# Patient Record
Sex: Female | Born: 1970 | Race: Black or African American | Hispanic: No | Marital: Single | Smoking: Current every day smoker
Health system: Southern US, Community
[De-identification: ages and names within clinical notes are randomized; demographics above are authoritative.]

## PROBLEM LIST (undated history)

## (undated) DIAGNOSIS — E669 Obesity, unspecified: Secondary | ICD-10-CM

## (undated) DIAGNOSIS — I10 Essential (primary) hypertension: Secondary | ICD-10-CM

## (undated) HISTORY — PX: OVARIAN CYST SURGERY: SHX726

---

## 2012-08-06 ENCOUNTER — Encounter (HOSPITAL_BASED_OUTPATIENT_CLINIC_OR_DEPARTMENT_OTHER): Payer: Self-pay

## 2012-08-06 ENCOUNTER — Emergency Department (HOSPITAL_BASED_OUTPATIENT_CLINIC_OR_DEPARTMENT_OTHER)
Admission: EM | Admit: 2012-08-06 | Discharge: 2012-08-06 | Disposition: A | Discharge status: Home / Self Care | Payer: Self-pay | Attending: Emergency Medicine | Admitting: Emergency Medicine

## 2012-08-06 DIAGNOSIS — F17200 Nicotine dependence, unspecified, uncomplicated: Secondary | ICD-10-CM | POA: Insufficient documentation

## 2012-08-06 DIAGNOSIS — H6121 Impacted cerumen, right ear: Secondary | ICD-10-CM

## 2012-08-06 DIAGNOSIS — R51 Headache: Secondary | ICD-10-CM | POA: Insufficient documentation

## 2012-08-06 DIAGNOSIS — J3489 Other specified disorders of nose and nasal sinuses: Secondary | ICD-10-CM | POA: Insufficient documentation

## 2012-08-06 DIAGNOSIS — E669 Obesity, unspecified: Secondary | ICD-10-CM | POA: Insufficient documentation

## 2012-08-06 DIAGNOSIS — H6120 Impacted cerumen, unspecified ear: Secondary | ICD-10-CM | POA: Insufficient documentation

## 2012-08-06 HISTORY — DX: Obesity, unspecified: E66.9

## 2012-08-06 NOTE — ED Notes (Signed)
Pt states that she has lost the hearing in her R ear about a week ago and that she is having intermittent pain in that ear as well.  Pt denies any drainage from the ear.  Pt c/o nasal congestion, and headache.

## 2012-08-06 NOTE — ED Notes (Signed)
Pt. Reports she can hear much better now and no drainage noted from the R ear.

## 2012-08-06 NOTE — ED Provider Notes (Signed)
Medical screening examination/treatment/procedure(s) were performed by non-physician practitioner and as supervising physician I was immediately available for consultation/collaboration.  Ethelda Chick, MD 08/06/12 [phone]

## 2012-08-06 NOTE — ED Provider Notes (Signed)
History     CSN: [id number]  Arrival date & time 08/06/12  1549   First MD Initiated Contact with Patient 08/06/12 1619      Chief Complaint  Patient presents with  . Hearing Problem  . Otalgia    (Consider location/radiation/quality/duration/timing/severity/associated sxs/prior treatment) HPI  Vanessa Gregory is a 42 y.o. female complaining of reduced hearing acuity out of right ear approximately one year week ago. She also endorses in otalgia in the same ear. She denies drainage, rhinorrhea, fever she does endorse right-sided nasal congestion and right-sided headache.  Past Medical History  Diagnosis Date  . Obesity     History reviewed. No pertinent past surgical history.  History reviewed. No pertinent family history.  History  Substance Use Topics  . Smoking status: Current Every Day Smoker -- 0.50 packs/day for 20 years    Types: Cigarettes  . Smokeless tobacco: Never Used  . Alcohol Use: 8.4 oz/week    14 Glasses of wine per week    OB History   Grav Para Term Preterm Abortions TAB SAB Ect Mult Living                  Review of Systems  Constitutional: Negative for fever.  HENT: Positive for ear pain and congestion.   Respiratory: Negative for shortness of breath.   Cardiovascular: Negative for chest pain.  Gastrointestinal: Negative for nausea, vomiting, abdominal pain and diarrhea.  Neurological: Positive for headaches.  All other systems reviewed and are negative.    Allergies  Review of patient's allergies indicates no known allergies.  Home Medications   Current Outpatient Rx  Name  Route  Sig  Dispense  Refill  . ibuprofen (ADVIL,MOTRIN) 200 MG tablet   Oral   Take 600 mg by mouth every 6 (six) hours as needed for pain.           BP 127/70  Pulse 77  Temp(Src) 98.8 F (37.1 C) (Oral)  Resp 18  Ht 5\' 9"  (1.753 m)  Wt 190 lb (86.183 kg)  BMI 28.05 kg/m2  SpO2 100%  LMP 07/14/2012  Physical Exam  Nursing note and vitals  reviewed. Constitutional: She is oriented to person, place, and time. She appears well-developed and well-nourished. No distress.  HENT:  Head: Normocephalic.  Right Ear: External ear normal.  Left Ear: External ear normal.  Mouth/Throat: Oropharynx is clear and moist.  Bilateral serum impaction  Eyes: Conjunctivae and EOM are normal. Pupils are equal, round, and reactive to light.  Cardiovascular: Normal rate, regular rhythm and intact distal pulses.   Pulmonary/Chest: Effort normal and breath sounds normal. No stridor. No respiratory distress. She has no wheezes. She has no rales. She exhibits no tenderness.  Abdominal: Soft. Bowel sounds are normal. She exhibits no distension and no mass. There is no tenderness. There is no rebound and no guarding.  Musculoskeletal: Normal range of motion.  Lymphadenopathy:    She has no cervical adenopathy.  Neurological: She is alert and oriented to person, place, and time.  Psychiatric: She has a normal mood and affect.    ED Course  Procedures (including critical care time)  Labs Reviewed - No data to display No results found.  5:12 PM  patient reports complete resolution of symptoms after cerumen  irrigation   1. Cerumen impaction, right       MDM   [name] is a 42 y.o. female with bilateral cerumen impaction, right-sided asymptomatic. After irrigation and removal patient reports complete resolution  of symptoms.   Filed Vitals:   08/06/12 1558  BP: 127/70  Pulse: 77  Temp: 98.8 F (37.1 C)  TempSrc: Oral  Resp: 18  Height: 5\' 9"  (1.753 m)  Weight: 190 lb (86.183 kg)  SpO2: 100%     Pt verbalized understanding and agrees with care plan. Outpatient follow-up and return precautions given.        Wynetta Emery, PA-C 08/06/12 1724

## 2013-02-26 ENCOUNTER — Encounter (HOSPITAL_BASED_OUTPATIENT_CLINIC_OR_DEPARTMENT_OTHER): Payer: Self-pay | Admitting: Emergency Medicine

## 2013-02-26 ENCOUNTER — Emergency Department (HOSPITAL_BASED_OUTPATIENT_CLINIC_OR_DEPARTMENT_OTHER)
Admission: EM | Admit: 2013-02-26 | Discharge: 2013-02-26 | Disposition: A | Discharge status: Home / Self Care | Payer: Self-pay | Attending: Emergency Medicine | Admitting: Emergency Medicine

## 2013-02-26 DIAGNOSIS — R220 Localized swelling, mass and lump, head: Secondary | ICD-10-CM | POA: Insufficient documentation

## 2013-02-26 DIAGNOSIS — E669 Obesity, unspecified: Secondary | ICD-10-CM | POA: Insufficient documentation

## 2013-02-26 DIAGNOSIS — J3489 Other specified disorders of nose and nasal sinuses: Secondary | ICD-10-CM | POA: Insufficient documentation

## 2013-02-26 DIAGNOSIS — F17200 Nicotine dependence, unspecified, uncomplicated: Secondary | ICD-10-CM | POA: Insufficient documentation

## 2013-02-26 DIAGNOSIS — H109 Unspecified conjunctivitis: Secondary | ICD-10-CM | POA: Insufficient documentation

## 2013-02-26 MED ORDER — ERYTHROMYCIN 5 MG/GM OP OINT: TOPICAL_OINTMENT | OPHTHALMIC | Status: DC

## 2013-02-26 NOTE — ED Provider Notes (Signed)
CSN: [id number]     Arrival date & time 02/26/13  0909 History   First MD Initiated Contact with Patient 02/26/13 [phone]     Chief Complaint  Patient presents with  . pink eye and facial swelling    (Consider location/radiation/quality/duration/timing/severity/associated sxs/prior Treatment) HPI Comments: 42 year old female presents with one week of right eye conjunctivitis. She states that numerous family members, including her child, grandchild, and other family members have all had symptoms similar to this. She now noticed that her left eye started become irritated. She is felt she's had swelling around her eye. She's also had congestion and "feeling like I'm getting a cold". As a fevers or chills. Denies any nausea or vomiting. She does not wear contacts. She states that she feels her vision is a little more blurry but is due to the excessive tearing.   Past Medical History  Diagnosis Date  . Obesity    History reviewed. No pertinent past surgical history. History reviewed. No pertinent family history. History  Substance Use Topics  . Smoking status: Current Every Day Smoker -- 0.50 packs/day for 20 years    Types: Cigarettes  . Smokeless tobacco: Never Used  . Alcohol Use: 8.4 oz/week    14 Glasses of wine per week   OB History   Grav Para Term Preterm Abortions TAB SAB Ect Mult Living                 Review of Systems  Constitutional: Negative for fever and chills.  HENT: Positive for congestion and rhinorrhea.   Eyes: Positive for pain, discharge (clear) and redness.  Respiratory: Negative for shortness of breath.   All other systems reviewed and are negative.    Allergies  Review of patient's allergies indicates no known allergies.  Home Medications   Current Outpatient Rx  Name  Route  Sig  Dispense  Refill  . erythromycin ophthalmic ointment      Place a 1/2 inch ribbon of ointment into the lower eyelid.   1 g   0   . ibuprofen (ADVIL,MOTRIN) 200 MG  tablet   Oral   Take 600 mg by mouth every 6 (six) hours as needed for pain.          BP 137/70  Pulse 72  Temp(Src) 98.3 F (36.8 C) (Oral)  Resp 16  Ht 5\' 9"  (1.753 m)  Wt 200 lb (90.719 kg)  BMI 29.52 kg/m2  SpO2 100%  LMP 02/18/2013 Physical Exam  Nursing note and vitals reviewed. Constitutional: She is oriented to person, place, and time. She appears well-developed and well-nourished.  HENT:  Head: Normocephalic and atraumatic.  Right Ear: External ear normal.  Left Ear: External ear normal.  Nose: Nose normal.  Eyes: EOM are normal. Pupils are equal, round, and reactive to light. Right conjunctiva is injected. Left conjunctiva is injected (Medial).  Tearing noted bilaterally  Pulmonary/Chest: Effort normal.  Abdominal: She exhibits no distension.  Neurological: She is alert and oriented to person, place, and time.  Skin: Skin is warm and dry.    ED Course  Procedures (including critical care time) Labs Review Labs Reviewed - No data to display Imaging Review No results found.  EKG Interpretation   None       MDM   1. Conjunctivitis    Patient likely has viral conjunctivitis. Given the length of her sx will give erythromycin ointment as well to cover for bacterial source. No signs of pain with EOM or concern for  ocular infection. She does not wear contacts. Discussed return precautions and will f/u with PCP as needed.    Audree Camel, MD 02/26/13 [phone]

## 2013-02-26 NOTE — ED Notes (Signed)
Pt states her child had pink eye she thinks she has had pink eye in right eye and woke up this morning with eye more swollen and puffy as well as right side of face. Also states this is causing her to have a headache.

## 2013-04-09 ENCOUNTER — Encounter (HOSPITAL_BASED_OUTPATIENT_CLINIC_OR_DEPARTMENT_OTHER): Payer: Self-pay | Admitting: Emergency Medicine

## 2013-04-09 ENCOUNTER — Emergency Department (HOSPITAL_BASED_OUTPATIENT_CLINIC_OR_DEPARTMENT_OTHER)
Admission: EM | Admit: 2013-04-09 | Discharge: 2013-04-09 | Disposition: A | Discharge status: Home / Self Care | Payer: No Typology Code available for payment source | Attending: Emergency Medicine | Admitting: Emergency Medicine

## 2013-04-09 DIAGNOSIS — F17200 Nicotine dependence, unspecified, uncomplicated: Secondary | ICD-10-CM | POA: Insufficient documentation

## 2013-04-09 DIAGNOSIS — S0993XA Unspecified injury of face, initial encounter: Secondary | ICD-10-CM | POA: Insufficient documentation

## 2013-04-09 DIAGNOSIS — S46909A Unspecified injury of unspecified muscle, fascia and tendon at shoulder and upper arm level, unspecified arm, initial encounter: Secondary | ICD-10-CM | POA: Insufficient documentation

## 2013-04-09 DIAGNOSIS — E669 Obesity, unspecified: Secondary | ICD-10-CM | POA: Insufficient documentation

## 2013-04-09 DIAGNOSIS — S4980XA Other specified injuries of shoulder and upper arm, unspecified arm, initial encounter: Secondary | ICD-10-CM | POA: Insufficient documentation

## 2013-04-09 DIAGNOSIS — Y92410 Unspecified street and highway as the place of occurrence of the external cause: Secondary | ICD-10-CM | POA: Insufficient documentation

## 2013-04-09 DIAGNOSIS — IMO0002 Reserved for concepts with insufficient information to code with codable children: Secondary | ICD-10-CM | POA: Insufficient documentation

## 2013-04-09 DIAGNOSIS — Y9389 Activity, other specified: Secondary | ICD-10-CM | POA: Insufficient documentation
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
## 2015-06-01 ENCOUNTER — Emergency Department (HOSPITAL_BASED_OUTPATIENT_CLINIC_OR_DEPARTMENT_OTHER)
Admission: EM | Admit: 2015-06-01 | Discharge: 2015-06-01 | Disposition: A | Discharge status: Home / Self Care | Payer: Worker's Compensation | Attending: Emergency Medicine | Admitting: Emergency Medicine

## 2015-06-01 ENCOUNTER — Encounter (HOSPITAL_BASED_OUTPATIENT_CLINIC_OR_DEPARTMENT_OTHER): Payer: Self-pay | Admitting: *Deleted

## 2015-06-01 DIAGNOSIS — W010XXA Fall on same level from slipping, tripping and stumbling without subsequent striking against object, initial encounter: Secondary | ICD-10-CM | POA: Diagnosis not present

## 2015-06-01 DIAGNOSIS — E669 Obesity, unspecified: Secondary | ICD-10-CM | POA: Insufficient documentation

## 2015-06-01 DIAGNOSIS — Y9389 Activity, other specified: Secondary | ICD-10-CM | POA: Insufficient documentation

## 2015-06-01 DIAGNOSIS — Y990 Civilian activity done for income or pay: Secondary | ICD-10-CM | POA: Diagnosis not present

## 2015-06-01 DIAGNOSIS — F17210 Nicotine dependence, cigarettes, uncomplicated: Secondary | ICD-10-CM | POA: Insufficient documentation

## 2015-06-01 DIAGNOSIS — S0992XA Unspecified injury of nose, initial encounter: Secondary | ICD-10-CM | POA: Diagnosis not present

## 2015-06-01 DIAGNOSIS — W19XXXA Unspecified fall, initial encounter: Secondary | ICD-10-CM

## 2015-06-01 DIAGNOSIS — Y9289 Other specified places as the place of occurrence of the external cause: Secondary | ICD-10-CM | POA: Insufficient documentation

## 2015-06-01 DIAGNOSIS — S0990XA Unspecified injury of head, initial encounter: Secondary | ICD-10-CM | POA: Insufficient documentation

## 2015-06-01 DIAGNOSIS — S29002A Unspecified injury of muscle and tendon of back wall of thorax, initial encounter: Secondary | ICD-10-CM | POA: Insufficient documentation

## 2015-06-01 DIAGNOSIS — M5489 Other dorsalgia: Secondary | ICD-10-CM

## 2015-06-01 MED ORDER — CYCLOBENZAPRINE HCL 10 MG PO TABS: 10.0000 mg | ORAL_TABLET | Freq: Two times a day (BID) | ORAL | Status: DC | PRN

## 2015-06-01 MED ORDER — IBUPROFEN 600 MG PO TABS: 600.0000 mg | ORAL_TABLET | Freq: Four times a day (QID) | ORAL | Status: DC | PRN

## 2015-06-01 NOTE — ED Notes (Signed)
Ice pack given for home use. Note for work provided. Pt directed to pharmacy to pick up medications

## 2015-06-01 NOTE — ED Provider Notes (Signed)
CSN: [id number]     Arrival date & time 06/01/15  1034 History   First MD Initiated Contact with Patient 06/01/15 1104     No chief complaint on file.    (Consider location/radiation/quality/duration/timing/severity/associated sxs/prior Treatment) HPI   Patient is a 45 year old female with no past medical history presents to the ED with complaint of back pain. She notes she was at work she slipped on a ramp resulting in him falling on her back. Denies any head injury or LOC. Endorses having constant aching pain to her upper back which she notes is worse with movement. Patient reports taking ibuprofen earlier this morning with mild relief. She also endorses having constant aching frontal headache, denies any aggravating or alleviating factors. Pt denies fever, neck stiffness, visual changes, photophobia, nasal congestion, rhinorrhea, sore throat, cough, SOB, CP, abdominal pain, N/V, urinary symptoms, numbness, tingling, weakness, seizures, syncope, saddle anesthesia, loss of bowel or bladder.    Past Medical History  Diagnosis Date  . Obesity    History reviewed. No pertinent past surgical history. No family history on file. Social History  Substance Use Topics  . Smoking status: Current Every Day Smoker -- 0.50 packs/day for 20 years    Types: Cigarettes  . Smokeless tobacco: Never Used  . Alcohol Use: 8.4 oz/week    14 Glasses of wine per week   OB History    No data available     Review of Systems  Musculoskeletal: Positive for back pain.  Neurological: Positive for headaches.  All other systems reviewed and are negative.     Allergies  Review of patient's allergies indicates no known allergies.  Home Medications   Prior to Admission medications   Medication Sig Start Date End Date Taking? Authorizing Provider  cyclobenzaprine (FLEXERIL) 10 MG tablet Take 1 tablet (10 mg total) by mouth 2 (two) times daily as needed for muscle spasms. 06/01/15   Barrett Henle,  PA-C  ibuprofen (ADVIL,MOTRIN) 600 MG tablet Take 1 tablet (600 mg total) by mouth every 6 (six) hours as needed. 06/01/15   Satira Sark Amery Vandenbos, PA-C   BP 155/84 mmHg  Pulse 86  Temp(Src) 98.2 F (36.8 C) (Oral)  Resp 18  Ht 5\' 9"  (1.753 m)  Wt 105.688 kg  BMI 34.39 kg/m2  SpO2 100%  LMP 05/25/2015 Physical Exam  Constitutional: She is oriented to person, place, and time. She appears well-developed and well-nourished.  HENT:  Head: Normocephalic and atraumatic. Head is without raccoon's eyes, without Battle's sign, without abrasion, without contusion and without laceration.  Right Ear: Tympanic membrane normal. No hemotympanum.  Left Ear: Tympanic membrane normal. No hemotympanum.  Nose: Right sinus exhibits frontal sinus tenderness. Right sinus exhibits no maxillary sinus tenderness. Left sinus exhibits frontal sinus tenderness. Left sinus exhibits no maxillary sinus tenderness.  Mouth/Throat: Uvula is midline, oropharynx is clear and moist and mucous membranes are normal. No oropharyngeal exudate.  Eyes: Conjunctivae and EOM are normal. Pupils are equal, round, and reactive to light. Right eye exhibits no discharge. Left eye exhibits no discharge. No scleral icterus.  Neck: Normal range of motion. Neck supple.  Cardiovascular: Normal rate, regular rhythm, normal heart sounds and intact distal pulses.   Pulmonary/Chest: Effort normal and breath sounds normal. No respiratory distress.  Abdominal: Soft. Bowel sounds are normal. She exhibits no distension and no mass. There is no tenderness. There is no rebound and no guarding.  Musculoskeletal: Normal range of motion. She exhibits tenderness. She exhibits no edema.  No  C/T/L midline tenderness. TTP at bilateral cervical paraspinal muscles and upper trapezius. FROM of neck and back. 5/5 strength in BLE and BUE with FROM. Sensation intact. 2+ radial and PT pulses.   Lymphadenopathy:    She has no cervical adenopathy.  Neurological: She  is alert and oriented to person, place, and time. She has normal reflexes. No cranial nerve deficit.  Pt able to stand and ambulate without assistance, no ataxia.  Skin: Skin is warm and dry.  Nursing note and vitals reviewed.   ED Course  Procedures (including critical care time) Labs Review Labs Reviewed - No data to display  Imaging Review No results found. I have personally reviewed and evaluated these images and lab results as part of my medical decision-making.  Filed Vitals:   06/01/15 1043  BP: 155/84  Pulse: 86  Temp: 98.2 F (36.8 C)  Resp: 18     MDM   Final diagnoses:  Other back pain  Fall, initial encounter    Patient presents with upper back pain status post fall that occurred earlier this morning. Denies head injury or LOC. No back pain red flags. VSS. Exam revealed no midline tenderness, tenderness at bilateral cervical paraspinal muscles and upper trapezius, full range of motion of neck and back. No neuro deficits. Bilateral upper and lower extremities neurovascularly intact. Patient able to stand and ambulate without assistance, no ataxia noted. I suspect patient's symptoms are likely due to muscle strain/spasm/contusion associated with recent fall. Due to patient only been tender along cervical paraspinal muscles and upper trapezius along with exam otherwise been unremarkable I do not feel any further workup or imaging is warranted at this time. Plan to discharge patient home with NSAIDs and muscle relaxant. Patient given resource guide to follow up with PCP.  Evaluation does not show pathology requring ongoing emergent intervention or admission. Pt is hemodynamically stable and mentating appropriately. Discussed findings/results and plan with patient/guardian, who agrees with plan. All questions answered. Return precautions discussed and outpatient follow up given.      Satira Sarkicole Elizabeth Rosewood HeightsNadeau, [place] JerseyPA-C 06/01/15 1146  Gwyneth SproutWhitney Plunkett, MD 06/01/15 [phone]

## 2015-06-01 NOTE — ED Notes (Signed)
States she was walking out door of work and ramp was slippery and she fell onto her back and floor was wood. PT ambulated to room 4 without difficulty.  C/o upper back pain. Also c/o h/a in forehead. Does not think she hit her  Head in fall.

## 2015-06-01 NOTE — Discharge Instructions (Signed)
Take your medications as prescribed. I also recommend applying ice/heat to affected region for 15-20 minutes 3-4 times daily. Refrain from doing any heavy lifting/exercising with her upper body until your pain has improved. Please follow up with a primary care provider from the Resource Guide provided below in 3-4 days. Please return to the Emergency Department if symptoms worsen or new onset of fever, numbness, tingling, groin anesthesia, loss of bowel or bladder, weakness, neck stiffness, visual changes, light sensitivity, abdominal pain, vomiting, urinary symptoms, seizures, syncope.    Emergency Department Resource Guide 1) Find a Doctor and Pay Out of Pocket Although you won't have to find out who is covered by your insurance plan, it is a good idea to ask around and get recommendations. You will then need to call the office and see if the doctor you have chosen will accept you as a new patient and what types of options they offer for patients who are self-pay. Some doctors offer discounts or will set up payment plans for their patients who do not have insurance, but you will need to ask so you aren't surprised when you get to your appointment.  2) Contact Your Local Health Department Not all health departments have doctors that can see patients for sick visits, but many do, so it is worth a call to see if yours does. If you don't know where your local health department is, you can check in your phone book. The CDC also has a tool to help you locate your state's health department, and many state websites also have listings of all of their local health departments.  3) Find a Walk-in Clinic If your illness is not likely to be very severe or complicated, you may want to try a walk in clinic. These are popping up all over the country in pharmacies, drugstores, and shopping centers. They're usually staffed by nurse practitioners or physician assistants that have been trained to treat common illnesses  and complaints. They're usually fairly quick and inexpensive. However, if you have serious medical issues or chronic medical problems, these are probably not your best option.  No Primary Care Doctor: - Call Health Connect at  [phone] - they can help you locate a primary care doctor that  accepts your insurance, provides certain services, etc. - Physician Referral Service- [phone]  Chronic Pain Problems: Organization         Address  Phone   Notes  [facility]  [phone]) [phone] Patients need to be referred by their primary care doctor.   Medication Assistance: Organization         Address  Phone   Notes  [facility] [address] [place], [place] [address] [phone]) [phone] --Must be a resident of [facility] [place] -- Must have NO insurance coverage whatsoever (no Medicaid/ Medicare, etc.) -- The pt. MUST have a primary care doctor that directs their care regularly and follows them in the community   MedAssist  [phone]) [phone]   [employer]  [phone]) [phone]    Agencies that provide inexpensive medical care: Organization         Address  Phone   Notes  [facility]  [phone]) [phone]   [facility]    [phone]   [facility] [address] [place], [place] [address] [phone]   [facility] [address], [place] [phone]) [phone]   [facility]anned Parenthood    (  [phone]   [facility]    [phone]   [facility]  [address], [place] Phone:  [phone], Fax:  [phone] Hours of Operation:  9 am - 6 pm, M-F.  Also accepts Medicaid/Medicare and self-pay.  [facility]  [address], [place] Phone: [phone], Fax: [phone]. Hours of Operation:  8:30 am - 5:30 pm, M-F.  Also accepts Medicaid and self-pay.  [facility] [place] [address], [place] Phone: [phone]   [facility] [address] Natasha Bence [place], [place] [phone], Ext. [phone] Mondays & Thursdays: 7-9 AM.  First 15 patients are seen on a first come, first serve basis.    Medicaid-accepting [facility] Providers:  Organization         Address  Phone   Notes  [facility] [address], [place] [phone] Also accepts self-pay patients.  [facility] [address] Laurell Josephs [place], [place]  [phone]   [facility] [address], [place] [phone]   [facility] [address], [place] [phone]   Renaye Rakers [address], [place]   [phone] Only accepts [place] Access [place] patients after they have their name applied to their card.   Self-Pay (no insurance) in [facility]:  Organization         Address  Phone   Notes  Sickle Cell Patients, [facility], [facility], [facility] [address] [place], [place] [phone]   [facility] [address] [place], [place] [phone]   [facility]  [address], [place] [phone]   [facility]/Dr. Osei-Bonsu  [address], [place] or [address], [place] [phone] Phone number for both [place] and [place] locations is the same.  Urgent Medical and [facility] [address] [place] [phone]   [facility] [address] [place] [address] [place] or [address] [phone]   [facility] [address], [place] [phone], phone; [phone], fax Sees patients 1st and 3rd Saturday of every month.  Must not qualify for public or private insurance (i.e. Medicaid, Medicare, [place] Health Choice, Veterans' Benefits)  Household income should be no more than 200% of the poverty level  The clinic cannot treat you if you are pregnant or think you are pregnant  Sexually transmitted diseases are not treated at the clinic.    Dental Care: Organization         Address  Phone  Notes  [facility] of [facility] [address] [place], [place] [phone] Accepts children up to age 75 who are enrolled in [place] or [place] Health Choice; pregnant women with a Medicaid card; and children who have applied for Medicaid or [place] Health Choice, but were declined, whose parents can pay a reduced fee at time of service.  [facility]  [address] [place] [address], [place] [phone] Accepts children up to age 22 who are enrolled in [place] or [place] Health Choice; pregnant women with a Medicaid card; and children who have applied for Medicaid or [place] Health Choice, but were declined, whose parents can pay a  reduced fee at time of service.  [facility]  [address], [place] [phone] Patients are seen by appointment only. Walk-ins are not accepted. [facility] will see patients 47 years of age and older. Monday - Tuesday (8am-5pm) Most Wednesdays (8:30-5pm) $30 per visit, cash only  [facility]  [address], [facility] [place] [phone] Patients are seen by appointment only. Walk-ins are not accepted. [facility] will see patients 47 years of age and older. One Wednesday Evening (Monthly: Volunteer Based).  $30 per visit, cash only  [facility]  [phone]) [phone] for adults; Children under age 524, call Graduate Pediatric Dentistry at [phone]. Children aged 624-14, please call [phone]) [phone] to request a pediatric application.  Dental services are provided in all areas of dental care including fillings, crowns and bridges, complete and partial dentures, implants, gum treatment, root canals, and extractions. Preventive care is  also provided. Treatment is provided to both adults and children. Patients are selected via a lottery and there is often a waiting list.   [facility] [address], [place]  [phone] [email or web address]   [facility] [address], [place], [place] [phone], Ext. [phone] Second and Fourth Thursday of each month, opens at 6:30 AM; Clinic ends at 9 AM.  Patients are seen on a first-come first-served basis, and a limited number are seen during each clinic.   [facility]  [address], [place], [place] [phone]) [phone]   Eligibility Requirements You must have lived in [place], [place], or [place] counties for at least the last three months.   You cannot be eligible for state or federal sponsored National Cityhealthcare insurance, including CIGNAVeterans Administration, [place]icaid, or [employer]Medicare.   You generally cannot be eligible for healthcare insurance through your employer.    How to apply: Eligibility screenings are held every Tuesday and Wednesday afternoon from 1:00 pm until 4:00 pm. You do not need an appointment for the interview!  [facility] [address], [place], [place] [phone]   [facility]  [phone]   [facility]  [phone]   [facility]  [phone]    Behavioral Health Resources in the Community: Intensive Outpatient Programs Organization         Address  Phone  Notes  [facility] [address], [place], [place] [phone]   [facility] [address], [place], [place] [phone]   [facility] [address], [place], [place]  [phone]   [facility] [address],  [place], [place] [phone] or [phone]   Substance Abuse Resources Organization         Address  Phone  Notes  [facility]  [phone]   [facility]  [phone]   [facility]  [phone]   [facility]  [phone]   Residential & Outpatient Substance Abuse Program  [phone]   Psychological Services Organization         Address  Phone  Notes  [facility]  [phone]- [phone]   [facility]  [phone]- [phone]   [facility] [address], [place] [phone] or [phone]    Mobile Crisis Teams Organization         Address  Phone  Notes  [facility], Mobile Crisis Care Unit  [phone]  [facility]  [address] [place], [place] [phone]   [facility] [address] [place] [phone]    Self-Help/Support Groups Organization         Address  Phone             Notes  [facility] [place] - variety of support groups  [phone]- [phone] Call for more information  Narcotics Anonymous (NA), [facility] [address], [employer] [place]  2 meetings at this location   Statistician         Address  Phone  Notes  [facility] [address],    [place]  [phone]   [facility]  [address] [place] [address], [place], [place] [phone]   [facility] [address] [place], [place] [phone] Admissions: 8am-3pm M-F  [facility] [address]    [place], [place] [phone]   [facility] [address] [place], [place], [place] [phone]   [facility] [address]  [place], [place] [phone]   [facility] - Intensive Outpatient [address] Laurell Josephs [address], [place], [place] [phone]   [facility] (Addiction Recovery [facility] [address]  [place], [place] [phone] or [phone]   [facility] ([facility]) [address], [place], [place] [phone] Accepts Medicaid  [facility] [place] [address],  [place] [phone]  Substance Abuse/Addiction Treatment   [facility] Organization         Address  Phone  Notes  [facility]  [phone]   Angie Fava, PhD [address] Ervin Knack [place], [place]   [phone] or [phone]   [facility]   [address] [place], [place] [phone]   [facility] [address] [place], [place] [phone] Insurance/Medicaid/sponsorship through [facility] Families [address]                                    [place], [place] [phone] Therapy/tele-psych/case  [facility] [address], [place] [phone]    Dr. Lolly Mustache  [phone]   [facility] [place]  [employer] [facility] Dept. 1) [address] [place] 2) [address] [place] 3)  [address], [place] [phone]  [phone]   [facility] Child Abuse Hotline [phone])[phone] or [phone] (After Hours)

## 2015-08-08 ENCOUNTER — Emergency Department (HOSPITAL_BASED_OUTPATIENT_CLINIC_OR_DEPARTMENT_OTHER)
Admission: EM | Admit: 2015-08-08 | Discharge: 2015-08-08 | Disposition: A | Discharge status: Home / Self Care | Payer: No Typology Code available for payment source | Attending: Emergency Medicine | Admitting: Emergency Medicine

## 2015-08-08 ENCOUNTER — Encounter (HOSPITAL_BASED_OUTPATIENT_CLINIC_OR_DEPARTMENT_OTHER): Payer: Self-pay | Admitting: Emergency Medicine

## 2015-08-08 DIAGNOSIS — E669 Obesity, unspecified: Secondary | ICD-10-CM | POA: Insufficient documentation

## 2015-08-08 DIAGNOSIS — Z3202 Encounter for pregnancy test, result negative: Secondary | ICD-10-CM | POA: Insufficient documentation

## 2015-08-08 DIAGNOSIS — N938 Other specified abnormal uterine and vaginal bleeding: Secondary | ICD-10-CM | POA: Insufficient documentation

## 2015-08-08 DIAGNOSIS — F17210 Nicotine dependence, cigarettes, uncomplicated: Secondary | ICD-10-CM | POA: Insufficient documentation

## 2015-08-08 LAB — CBC WITH DIFFERENTIAL/PLATELET
BASOS ABS: 0.1 10*3/uL (ref 0.0–0.1)
BASOS PCT: 1 %
EOS ABS: 0 10*3/uL (ref 0.0–0.7)
EOS PCT: 1 %
HCT: 27.9 % — ABNORMAL LOW (ref 36.0–46.0)
Hemoglobin: 8.1 g/dL — ABNORMAL LOW (ref 12.0–15.0)
Lymphocytes Relative: 31 %
Lymphs Abs: 1.9 10*3/uL (ref 0.7–4.0)
MCH: 18.3 pg — ABNORMAL LOW (ref 26.0–34.0)
MCHC: 29 g/dL — ABNORMAL LOW (ref 30.0–36.0)
MCV: 63 fL — ABNORMAL LOW (ref 78.0–100.0)
Monocytes Absolute: 0.6 10*3/uL (ref 0.1–1.0)
Monocytes Relative: 10 %
Neutro Abs: 3.6 10*3/uL (ref 1.7–7.7)
Neutrophils Relative %: 57 %
PLATELETS: 236 10*3/uL (ref 150–400)
RBC: 4.43 MIL/uL (ref 3.87–5.11)
RDW: 20.1 % — AB (ref 11.5–15.5)
WBC: 6.2 10*3/uL (ref 4.0–10.5)

## 2015-08-08 LAB — PREGNANCY, URINE: PREG TEST UR: NEGATIVE

## 2015-08-08 MED ORDER — FERROUS SULFATE 325 (65 FE) MG PO TABS: 325.0000 mg | ORAL_TABLET | Freq: Every day | ORAL | Status: DC

## 2015-08-08 NOTE — ED Notes (Signed)
Pt states she has had vaginal bleeding for about the last 11-12 days and before that had just finished her monthly cycle less than a week prior to this episode of vaginal bleeding.  Pt denies any pain or nausea, but states some generalized weakness started this morning.

## 2015-08-08 NOTE — Discharge Instructions (Signed)

## 2015-08-08 NOTE — ED Provider Notes (Signed)
CSN: [id number]     Arrival date & time 08/08/15  0945 History   First MD Initiated Contact with Patient 08/08/15 1011     Chief Complaint  Patient presents with  . Vaginal Bleeding     (Consider location/radiation/quality/duration/timing/severity/associated sxs/prior Treatment) Patient is a 45 y.o. female presenting with vaginal bleeding. The history is provided by the patient.  Vaginal Bleeding Quality:  Bright red and clots Severity:  Moderate Onset quality:  Gradual Duration:  3 weeks Progression:  Waxing and waning Chronicity:  New Menstrual history:  Irregular Number of pads used:  2/day Possible pregnancy: no   Relieved by:  Nothing Worsened by:  Nothing tried Ineffective treatments:  None tried Associated symptoms: no abdominal pain, no fever and no vaginal discharge   Risk factors: no bleeding disorder     Past Medical History  Diagnosis Date  . Obesity    Past Surgical History  Procedure Laterality Date  . Ovarian cyst surgery     No family history on file. Social History  Substance Use Topics  . Smoking status: Current Every Day Smoker -- 0.50 packs/day for 20 years    Types: Cigarettes  . Smokeless tobacco: Never Used  . Alcohol Use: 3.0 oz/week    5 Glasses of wine per week     Comment: some days   OB History    No data available     Review of Systems  Constitutional: Negative for fever.  Gastrointestinal: Negative for abdominal pain.  Genitourinary: Positive for vaginal bleeding. Negative for vaginal discharge.  All other systems reviewed and are negative.     Allergies  Review of patient's allergies indicates no known allergies.  Home Medications   Prior to Admission medications   Medication Sig Start Date End Date Taking? Authorizing Provider  cyclobenzaprine (FLEXERIL) 10 MG tablet Take 1 tablet (10 mg total) by mouth 2 (two) times daily as needed for muscle spasms. 06/01/15   Barrett Henle, PA-C  ibuprofen (ADVIL,MOTRIN)  600 MG tablet Take 1 tablet (600 mg total) by mouth every 6 (six) hours as needed. 06/01/15   Satira Sark Nadeau, PA-C   BP 157/74 mmHg  Pulse 86  Temp(Src) 98.6 F (37 C) (Oral)  Resp 18  Ht  (1.753 m)  Wt 220 lb (99.791 kg)  BMI 32.47 kg/m2  SpO2 100%  LMP 07/28/2015 (Approximate) Physical Exam  Constitutional: She is oriented to person, place, and time. She appears well-developed and well-nourished. No distress.  HENT:  Head: Normocephalic.  Eyes: Conjunctivae are normal.  Neck: Neck supple. No tracheal deviation present.  Cardiovascular: Normal rate and regular rhythm.   Pulmonary/Chest: Effort normal. No respiratory distress.  Abdominal: Soft. She exhibits no distension.  Genitourinary: Uterus normal. Cervix exhibits no motion tenderness and no discharge. Right adnexum displays no mass, no tenderness and no fullness. Left adnexum displays no mass, no tenderness and no fullness. There is bleeding (slight, no pooling in vault) in the vagina.  Neurological: She is alert and oriented to person, place, and time.  Skin: Skin is warm and dry.  Psychiatric: She has a normal mood and affect.    ED Course  Procedures (including critical care time) Labs Review Labs Reviewed  CBC WITH DIFFERENTIAL/PLATELET - Abnormal; Notable for the following:    Hemoglobin 8.1 (*)    HCT 27.9 (*)    MCV 63.0 (*)    MCH 18.3 (*)    MCHC 29.0 (*)    RDW 20.1 (*)  All other components within normal limits  PREGNANCY, URINE    Imaging Review No results found. I have personally reviewed and evaluated these images and lab results as part of my medical decision-making.   EKG Interpretation None      MDM   Final diagnoses:  DUB (dysfunctional uterine bleeding)   45 y.o. female presents with vaginal bleeding that has been ongoing and irregular. Hb is low but not below transfusion threshold. No history of coagulopathy, not on blood thinners. Not symptomatic from anemia at this  point. MCV is low suggesting chronic blood loss with iron deficiency. No hemodynamic instability. Likely fibroid or other DUB based on history. Recommended iron supplementation and f/u for recheck of blood work and evaluation by [facility] for DUB. Return precautions discussed for worsening or new concerning symptoms. Specifically return with worsening bleeding or anemia symptoms.     Lyndal Pulleyaniel Trevante [place], MD 08/09/15 [phone]

## 2015-09-07 ENCOUNTER — Encounter: Payer: Self-pay | Admitting: Obstetrics & Gynecology

## 2017-02-08 ENCOUNTER — Encounter (HOSPITAL_BASED_OUTPATIENT_CLINIC_OR_DEPARTMENT_OTHER): Payer: Self-pay | Admitting: Emergency Medicine

## 2017-02-08 ENCOUNTER — Emergency Department (HOSPITAL_BASED_OUTPATIENT_CLINIC_OR_DEPARTMENT_OTHER)
Admission: EM | Admit: 2017-02-08 | Discharge: 2017-02-08 | Disposition: A | Discharge status: Home / Self Care | Payer: No Typology Code available for payment source | Attending: Emergency Medicine | Admitting: Emergency Medicine

## 2017-02-08 DIAGNOSIS — Y929 Unspecified place or not applicable: Secondary | ICD-10-CM | POA: Insufficient documentation

## 2017-02-08 DIAGNOSIS — M791 Myalgia, unspecified site: Secondary | ICD-10-CM

## 2017-02-08 DIAGNOSIS — Y999 Unspecified external cause status: Secondary | ICD-10-CM | POA: Insufficient documentation

## 2017-02-08 DIAGNOSIS — Z79899 Other long term (current) drug therapy: Secondary | ICD-10-CM | POA: Insufficient documentation

## 2017-02-08 DIAGNOSIS — Y939 Activity, unspecified: Secondary | ICD-10-CM | POA: Diagnosis not present

## 2017-02-08 DIAGNOSIS — F17210 Nicotine dependence, cigarettes, uncomplicated: Secondary | ICD-10-CM | POA: Diagnosis not present

## 2017-02-08 MED ORDER — CYCLOBENZAPRINE HCL 10 MG PO TABS: 10.0000 mg | ORAL_TABLET | Freq: Three times a day (TID) | ORAL | 0 refills | Status: DC | PRN

## 2017-02-08 MED ORDER — KETOROLAC TROMETHAMINE 60 MG/2ML IM SOLN
15.0000 mg | Freq: Once | INTRAMUSCULAR | Status: AC
  Administered 2017-02-08: 15 mg via INTRAMUSCULAR
  Filled 2017-02-08: qty 2
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
## 2017-02-08 NOTE — Discharge Instructions (Signed)
Start taking ibuprofen tomorrow, you can't have any today because he had a shot in the emergency department.  For pain control you may take up to  of ibuprofen (that is usually 4 over the counter pills)  3 times a day (take with food) and acetaminophen  (this is 3 over the counter pills) four times a day. Do not drink alcohol or combine with other medications that have acetaminophen as an ingredient (Read the labels!).    For breakthrough pain you may take Flexeril. Do not drink alcohol, drive or operate heavy machinery when taking Flexeril.  Please follow with your primary care doctor in the next 2 days for a check-up. They must obtain records for further management.   Do not hesitate to return to the Emergency Department for any new, worsening or concerning symptoms.

## 2019-05-06 ENCOUNTER — Emergency Department (HOSPITAL_BASED_OUTPATIENT_CLINIC_OR_DEPARTMENT_OTHER): Payer: No Typology Code available for payment source

## 2019-05-06 ENCOUNTER — Other Ambulatory Visit: Payer: Self-pay

## 2019-05-06 ENCOUNTER — Emergency Department (HOSPITAL_BASED_OUTPATIENT_CLINIC_OR_DEPARTMENT_OTHER)
Admission: EM | Admit: 2019-05-06 | Discharge: 2019-05-06 | Disposition: A | Discharge status: Home / Self Care | Payer: No Typology Code available for payment source | Attending: Emergency Medicine | Admitting: Emergency Medicine

## 2019-05-06 ENCOUNTER — Encounter (HOSPITAL_BASED_OUTPATIENT_CLINIC_OR_DEPARTMENT_OTHER): Payer: Self-pay | Admitting: Emergency Medicine

## 2019-05-06 DIAGNOSIS — S8002XA Contusion of left knee, initial encounter: Secondary | ICD-10-CM | POA: Diagnosis not present

## 2019-05-06 DIAGNOSIS — Y990 Civilian activity done for income or pay: Secondary | ICD-10-CM | POA: Diagnosis not present

## 2019-05-06 DIAGNOSIS — Y9389 Activity, other specified: Secondary | ICD-10-CM | POA: Insufficient documentation

## 2019-05-06 DIAGNOSIS — W19XXXA Unspecified fall, initial encounter: Secondary | ICD-10-CM

## 2019-05-06 DIAGNOSIS — Y9289 Other specified places as the place of occurrence of the external cause: Secondary | ICD-10-CM | POA: Diagnosis not present

## 2019-05-06 DIAGNOSIS — W010XXA Fall on same level from slipping, tripping and stumbling without subsequent striking against object, initial encounter: Secondary | ICD-10-CM | POA: Insufficient documentation

## 2019-05-06 DIAGNOSIS — S93492A Sprain of other ligament of left ankle, initial encounter: Secondary | ICD-10-CM | POA: Diagnosis not present

## 2019-05-06 DIAGNOSIS — F17210 Nicotine dependence, cigarettes, uncomplicated: Secondary | ICD-10-CM | POA: Insufficient documentation

## 2019-05-06 DIAGNOSIS — S8992XA Unspecified injury of left lower leg, initial encounter: Secondary | ICD-10-CM | POA: Diagnosis present

## 2019-05-06 MED ORDER — KETOROLAC TROMETHAMINE 60 MG/2ML IM SOLN
60.0000 mg | Freq: Once | INTRAMUSCULAR | Status: AC
  Administered 2019-05-06: 60 mg via INTRAMUSCULAR
  Filled 2019-05-06: qty 2

## 2019-05-06 NOTE — ED Notes (Signed)
Patient transported to X-ray 

## 2019-05-06 NOTE — ED Triage Notes (Addendum)
Pt at work 2 days ago and tripped on some "material" on the floor and fell.  Was not evaluated medically at the time. Left knee and ankle pain and swelling. WC injury. Drove self to ED.

## 2019-05-07 NOTE — ED Provider Notes (Signed)
[place] EMERGENCY DEPARTMENT Provider Note   CSN: [id number] Arrival date & time: 05/06/19  0915     History Chief Complaint  Patient presents with  . Leg Injury    Vanessa Gregory is a 48 y.o. female.  HPI       48 year old female presents with concern for left knee and ankle pain after falling at work 2 days ago.  Reports that she slipped on some material that was on the floor and fell down with her knee flexed and ankle underneath her.  She did not seek medical evaluation initially, but has had continued pain, and specifically pain with ambulation, left work today for evaluation.  Reports that the pain is about 8 out of 10.  Notes that she had swelling of with ankle and knee.  Denies numbness, weakness.  Denies any other injuries related to the fall including no head trauma, neck pain, back pain.   Past Medical History:  Diagnosis Date  . Obesity     There are no problems to display for this patient.   Past Surgical History:  Procedure Laterality Date  . OVARIAN CYST SURGERY       OB History   No obstetric history on file.     No family history on file.  Social History   Tobacco Use  . Smoking status: Current Every Day Smoker    Packs/day: 0.50    Years: 20.00    Pack years: 10.00    Types: Cigarettes  . Smokeless tobacco: Never Used  Substance Use Topics  . Alcohol use: Yes    Alcohol/week: 5.0 standard drinks    Types: 5 Glasses of wine per week    Comment: some days  . Drug use: No    Home Medications Prior to Admission medications   Not on File    Allergies    Patient has no known allergies.  Review of Systems   Review of Systems  Constitutional: Negative for fever.  Respiratory: Negative for shortness of breath.   Cardiovascular: Negative for chest pain.  Musculoskeletal: Positive for arthralgias, gait problem and joint swelling. Negative for back pain and neck pain.  Skin: Negative for wound.  Neurological: Negative  for weakness, numbness and headaches.    Physical Exam Updated Vital Signs BP (!) 148/68   Pulse 72   Temp 98.4 F (36.9 C) (Oral)   Resp 18   Ht 5\' 9"  (1.753 m)   Wt 99.8 kg   LMP 04/05/2019   SpO2 99%   BMI 32.49 kg/m   Physical Exam Vitals and nursing note reviewed.  Constitutional:      General: She is not in acute distress.    Appearance: She is well-developed. She is not diaphoretic.  HENT:     Head: Normocephalic and atraumatic.  Eyes:     Conjunctiva/sclera: Conjunctivae normal.  Cardiovascular:     Rate and Rhythm: Normal rate and regular rhythm.  Pulmonary:     Effort: Pulmonary effort is normal. No respiratory distress.  Musculoskeletal:        General: Swelling (left ankle and knee slight) and tenderness (medial malleolus, lateral malleolus, ATFL, no tenderness over 5th MTP, prox fibula,  does note tenderness diffusely over knee and anterior tibia, no specific tibial plateau tenderness, slight contusion over proximal tibia inferior to tibial plateau) present. No deformity.     Cervical back: Normal range of motion.     Left ankle: Normal pulse.     Comments: Slight contusion  anterior tibia  Skin:    General: Skin is warm and dry.     Findings: No erythema or rash.  Neurological:     Mental Status: She is alert and oriented to person, place, and time.     ED Results / Procedures / Treatments   Labs (all labs ordered are listed, but only abnormal results are displayed) Labs Reviewed - No data to display  EKG None  Radiology DG Tibia/Fibula Left  Result Date: 05/06/2019 CLINICAL DATA:  Left lower leg pain since a trip and fall 2 days ago. Initial encounter. EXAM: LEFT TIBIA AND FIBULA - 2 VIEW COMPARISON:  None. FINDINGS: There is no evidence of fracture or other focal bone lesions. Soft tissues are unremarkable. IMPRESSION: Negative exam. Electronically Signed   By: Drusilla Kanner M.D.   On: 05/06/2019 10:51   DG Ankle Complete Left  Result Date:  05/06/2019 CLINICAL DATA:  Left ankle pain since a trip and fall 2 days ago. Initial encounter. EXAM: LEFT ANKLE COMPLETE - 3+ VIEW COMPARISON:  None. FINDINGS: There is no evidence of fracture, dislocation, or joint effusion. There is no evidence of arthropathy or other focal bone abnormality. Soft tissues are unremarkable. IMPRESSION: Negative exam. Electronically Signed   By: Drusilla Kanner M.D.   On: 05/06/2019 10:50   DG Knee Complete 4 Views Left  Result Date: 05/06/2019 CLINICAL DATA:  Left knee pain since a trip and fall 2 days ago. Initial encounter. EXAM: LEFT KNEE - COMPLETE 4+ VIEW COMPARISON:  None. FINDINGS: No evidence of fracture, dislocation, or joint effusion. No evidence of arthropathy or other focal bone abnormality. Soft tissues are unremarkable. IMPRESSION: Negative exam. Electronically Signed   By: Drusilla Kanner M.D.   On: 05/06/2019 10:49    Procedures Procedures (including critical care time)  Medications Ordered in ED Medications  ketorolac (TORADOL) injection 60 mg (60 mg Intramuscular Given 05/06/19 1027)    ED Course  I have reviewed the triage vital signs and the nursing notes.  Pertinent labs & imaging results that were available during my care of the patient were reviewed by me and considered in my medical decision making (see chart for details).    MDM Rules/Calculators/A&P                     48 year old female presents with concern for left knee and ankle pain after falling at work 2 days ago.  Denies other injuries from fall or areas of pain. XR without signs of abnormality. No sign of patellar tendon injury, doubt occult tibial plateau fx given areas of pain and suspect knee pain is secondary to contusion.  Ankle without sign of fracture, suspect likely sprain.  Recommend elevation, ice, ibuprofen, tylenol, WBAT and sports medicine follow up. Patient discharged in stable condition with understanding of reasons to return.      Final Clinical  Impression(s) / ED Diagnoses Final diagnoses:  Fall, initial encounter  Sprain of anterior talofibular ligament of left ankle, initial encounter  Contusion of left knee, initial encounter    Rx / DC Orders ED Discharge Orders    None       Alvira Monday, MD 05/07/19 [phone]

## 2019-05-08 ENCOUNTER — Ambulatory Visit: Payer: Self-pay | Admitting: Family Medicine

## 2019-05-13 ENCOUNTER — Ambulatory Visit: Payer: Self-pay | Admitting: Family Medicine

## 2019-05-13 NOTE — Progress Notes (Deleted)
  Lalana Wachter - 48 y.o. female MRN [id number]  Date of birth: [birth year]  SUBJECTIVE:  Including CC & ROS.  No chief complaint on file.   Takyra Cantrall is a 48 y.o. female that is  ***.  ***   Review of Systems  HISTORY: Past Medical, Surgical, Social, and Family History Reviewed & Updated per EMR.   Pertinent Historical Findings include:  Past Medical History:  Diagnosis Date  . Obesity     Past Surgical History:  Procedure Laterality Date  . OVARIAN CYST SURGERY      No Known Allergies  No family history on file.   Social History   Socioeconomic History  . Marital status: Single    Spouse name: Not on file  . Number of children: Not on file  . Years of education: Not on file  . Highest education level: Not on file  Occupational History  . Not on file  Tobacco Use  . Smoking status: Current Every Day Smoker    Packs/day: 0.50    Years: 20.00    Pack years: 10.00    Types: Cigarettes  . Smokeless tobacco: Never Used  Substance and Sexual Activity  . Alcohol use: Yes    Alcohol/week: 5.0 standard drinks    Types: 5 Glasses of wine per week    Comment: some days  . Drug use: No  . Sexual activity: Yes    Birth control/protection: Surgical  Other Topics Concern  . Not on file  Social History Narrative  . Not on file   Social Determinants of Health   Financial Resource Strain:   . Difficulty of Paying Living Expenses: Not on file  Food Insecurity:   . Worried About Charity fundraiser in the Last Year: Not on file  . Ran Out of Food in the Last Year: Not on file  Transportation Needs:   . Lack of Transportation (Medical): Not on file  . Lack of Transportation (Non-Medical): Not on file  Physical Activity:   . Days of Exercise per Week: Not on file  . Minutes of Exercise per Session: Not on file  Stress:   . Feeling of Stress : Not on file  Social Connections:   . Frequency of Communication with Friends and Family: Not on file  . Frequency  of Social Gatherings with Friends and Family: Not on file  . Attends Religious Services: Not on file  . Active Member of Clubs or Organizations: Not on file  . Attends Archivist Meetings: Not on file  . Marital Status: Not on file  Intimate Partner Violence:   . Fear of Current or Ex-Partner: Not on file  . Emotionally Abused: Not on file  . Physically Abused: Not on file  . Sexually Abused: Not on file     PHYSICAL EXAM:  VS: There were no vitals taken for this visit. Physical Exam Gen: NAD, alert, cooperative with exam, well-appearing ENT: normal lips, normal nasal mucosa,  Eye: normal EOM, normal conjunctiva and lids CV:  no edema, +2 pedal pulses   Resp: no accessory muscle use, non-labored,  GI: no masses or tenderness, no hernia  Skin: no rashes, no areas of induration  Neuro: normal tone, normal sensation to touch Psych:  normal insight, alert and oriented MSK:  ***      ASSESSMENT & PLAN:   No problem-specific Assessment & Plan notes found for this encounter.

## 2020-12-18 IMAGING — CR DG KNEE COMPLETE 4+V*L*
4 series · 4 of 4 positions shown · non-contrast
Comparison: None.

CLINICAL DATA: Left knee pain since a trip and fall 2 days ago.
Initial encounter.

EXAM:
LEFT KNEE - COMPLETE 4+ VIEW

[t knee ap left]
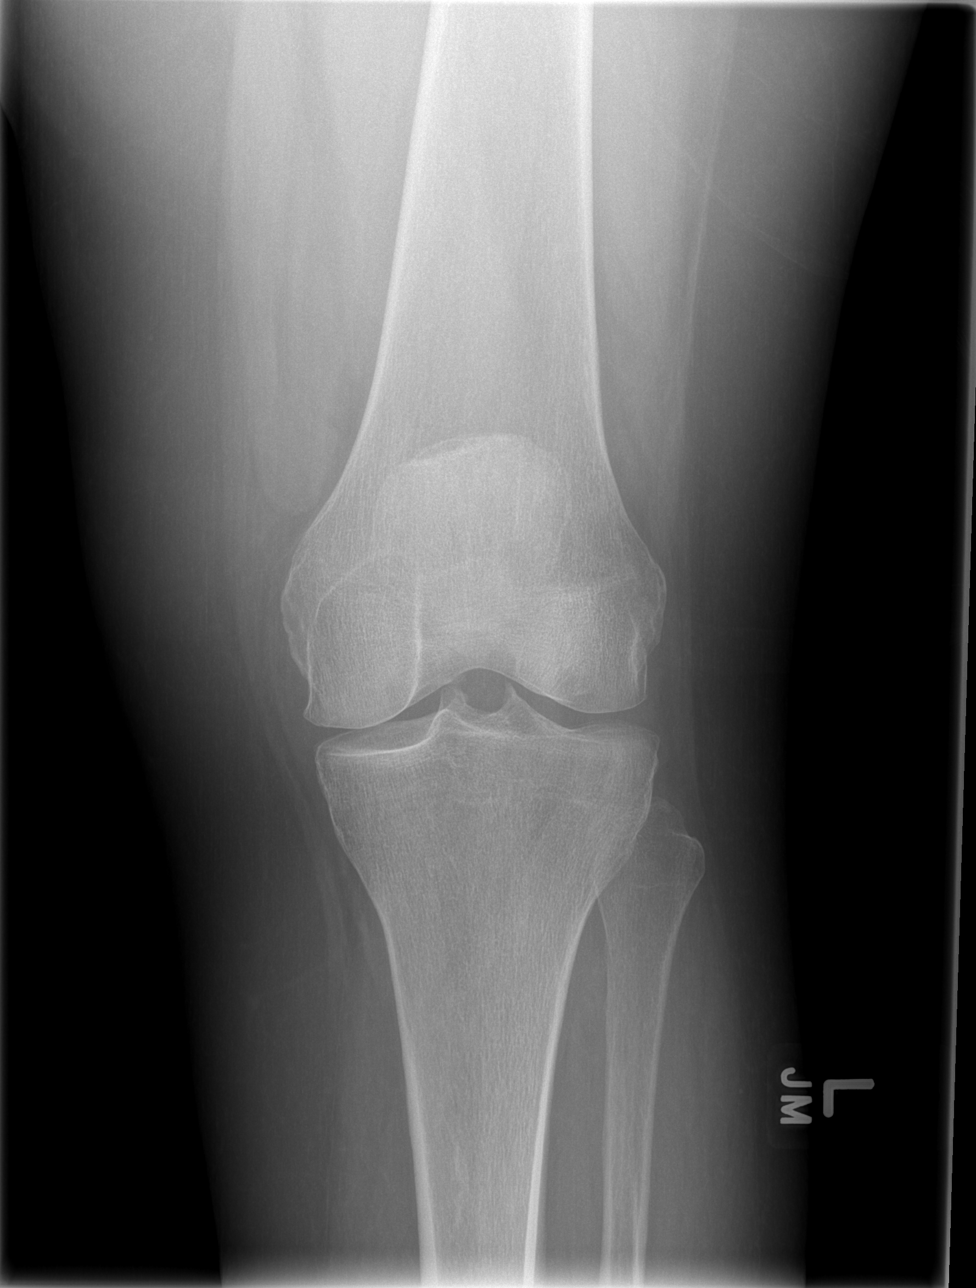

[t knee oblique left (1 of 2)]
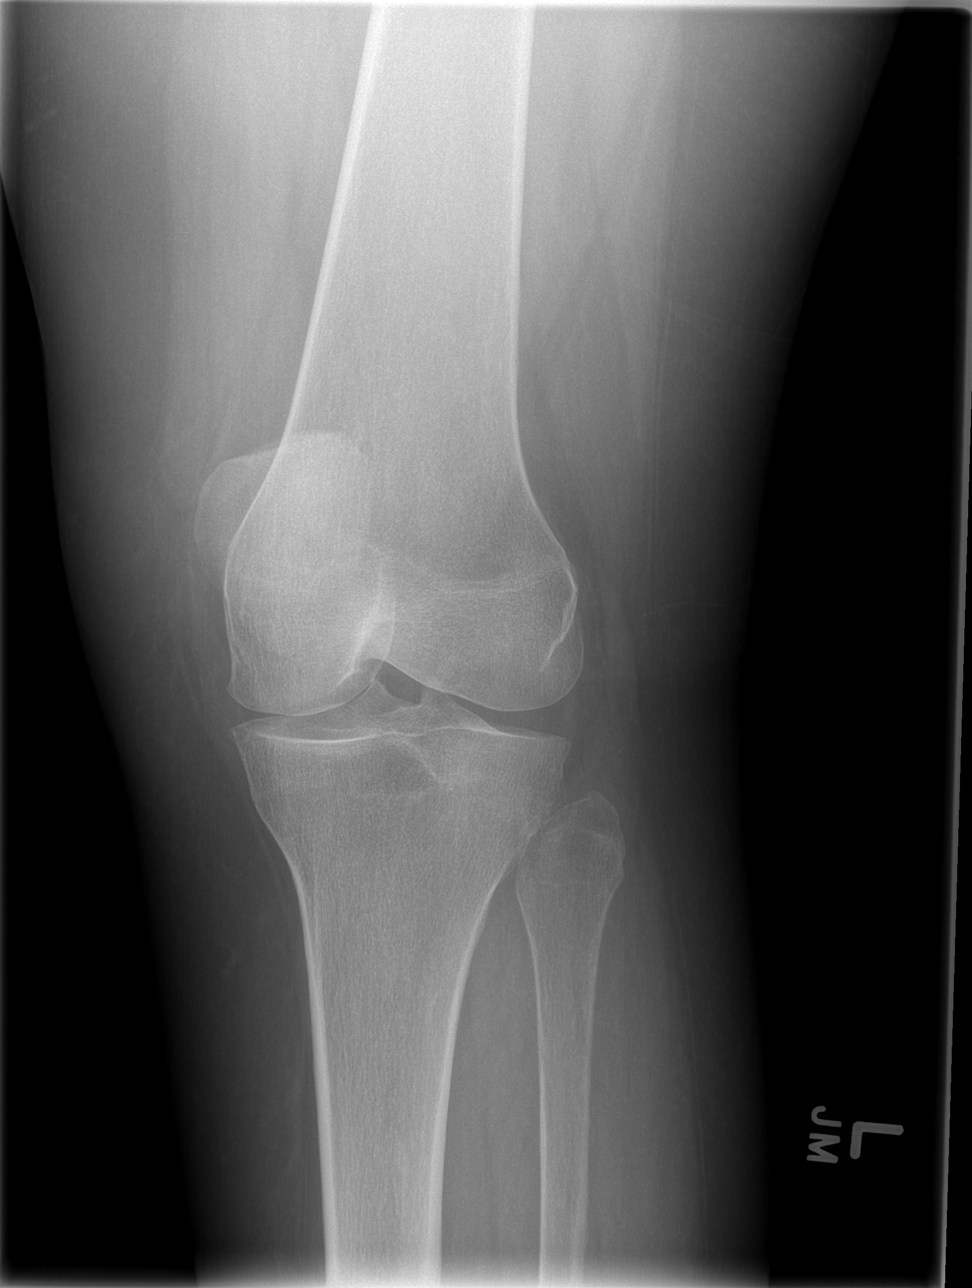

[t knee oblique left (2 of 2)]
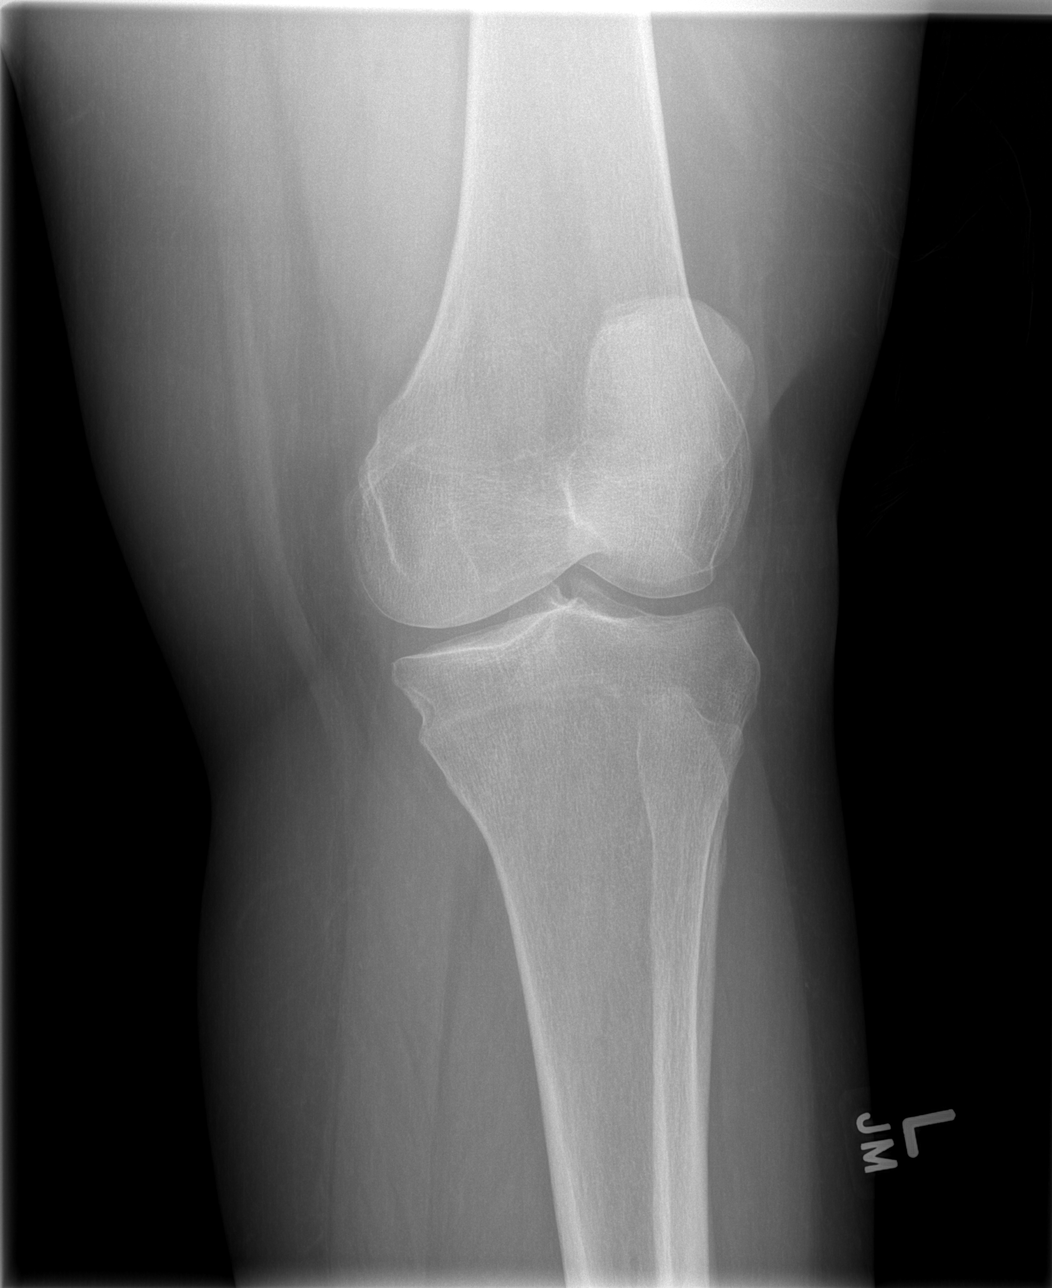

[t knee lat left]
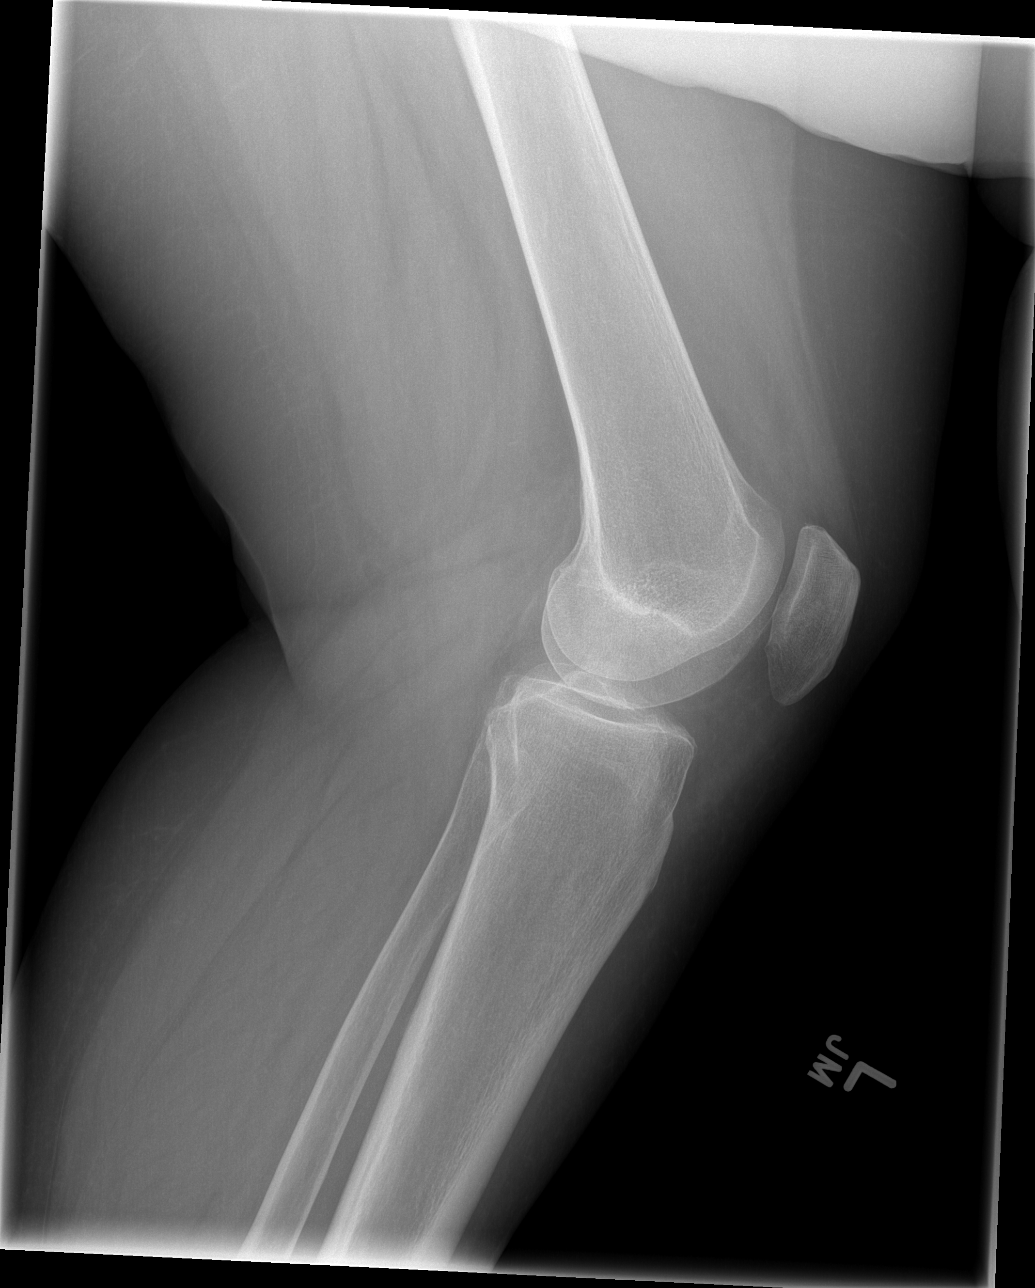

[4 of 4 positions shown; findings below may reference images not displayed]

FINDINGS: No evidence of fracture, dislocation, or joint effusion. No evidence
of arthropathy or other focal bone abnormality. Soft tissues are
unremarkable.
IMPRESSION: Negative exam.

## 2020-12-18 IMAGING — CR DG ANKLE COMPLETE 3+V*L*
3 series · 3 of 3 positions shown · non-contrast
Comparison: None.

CLINICAL DATA: Left ankle pain since a trip and fall 2 days ago.
Initial encounter.

EXAM:
LEFT ANKLE COMPLETE - 3+ VIEW

[t ankle joint ap left]
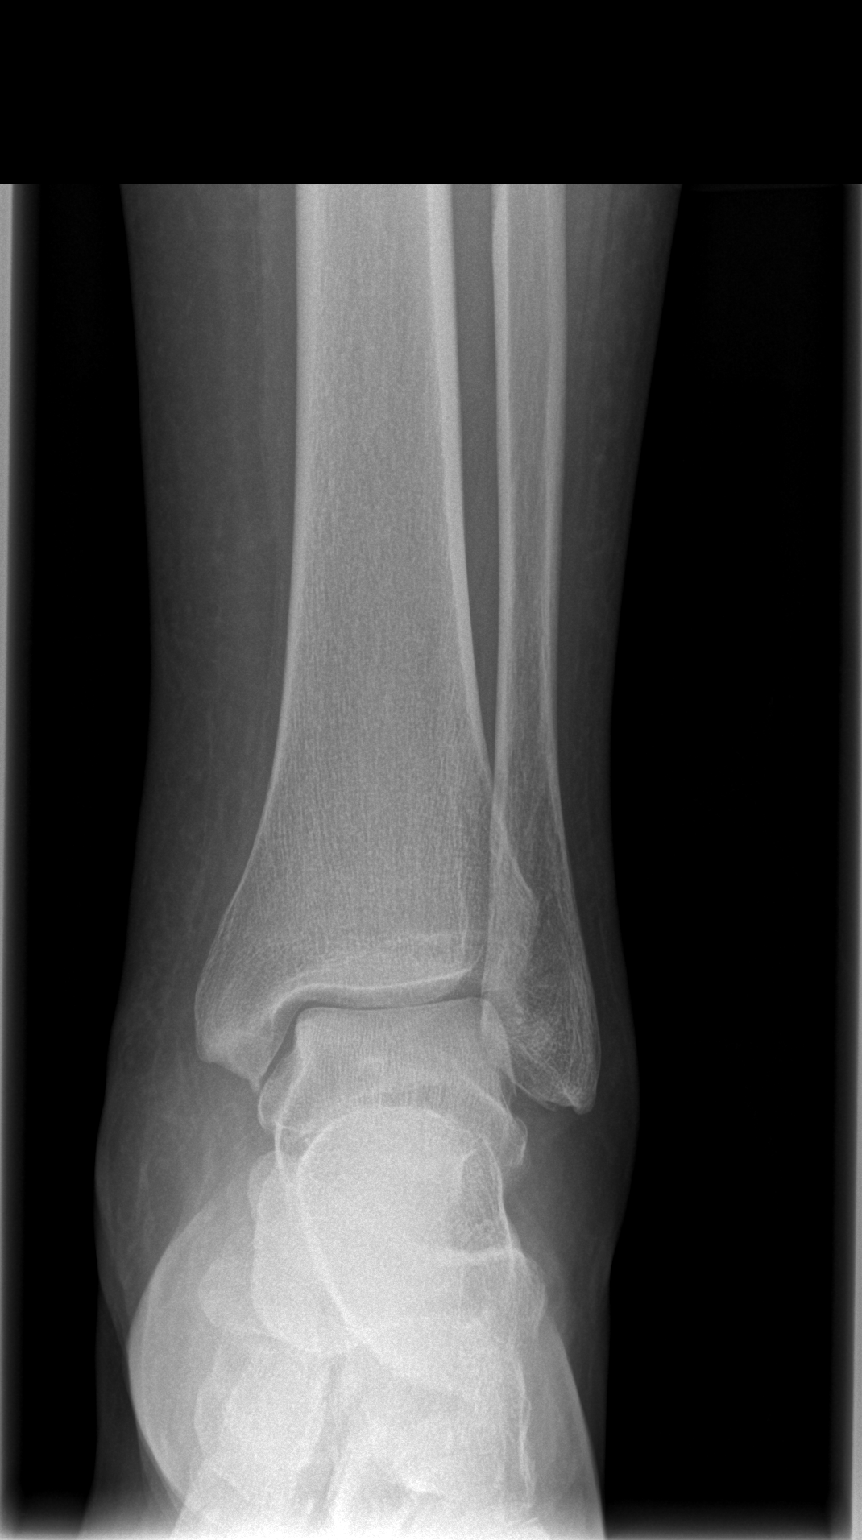

[t ankle joint oblique left]
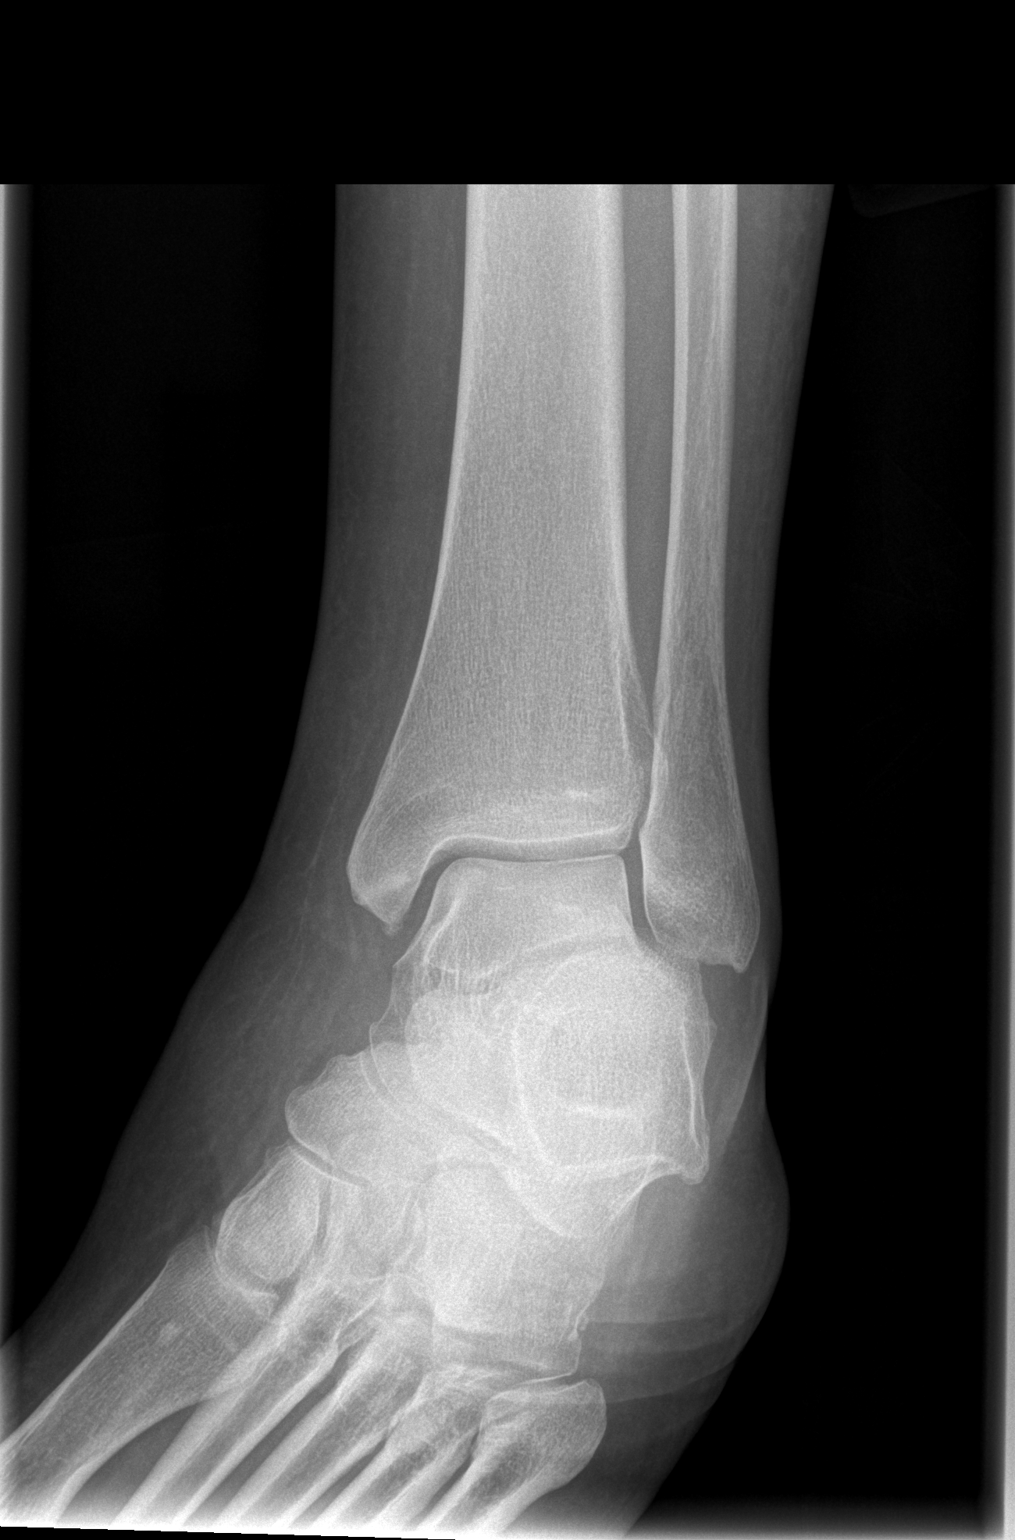

[t ankle joint lat left]
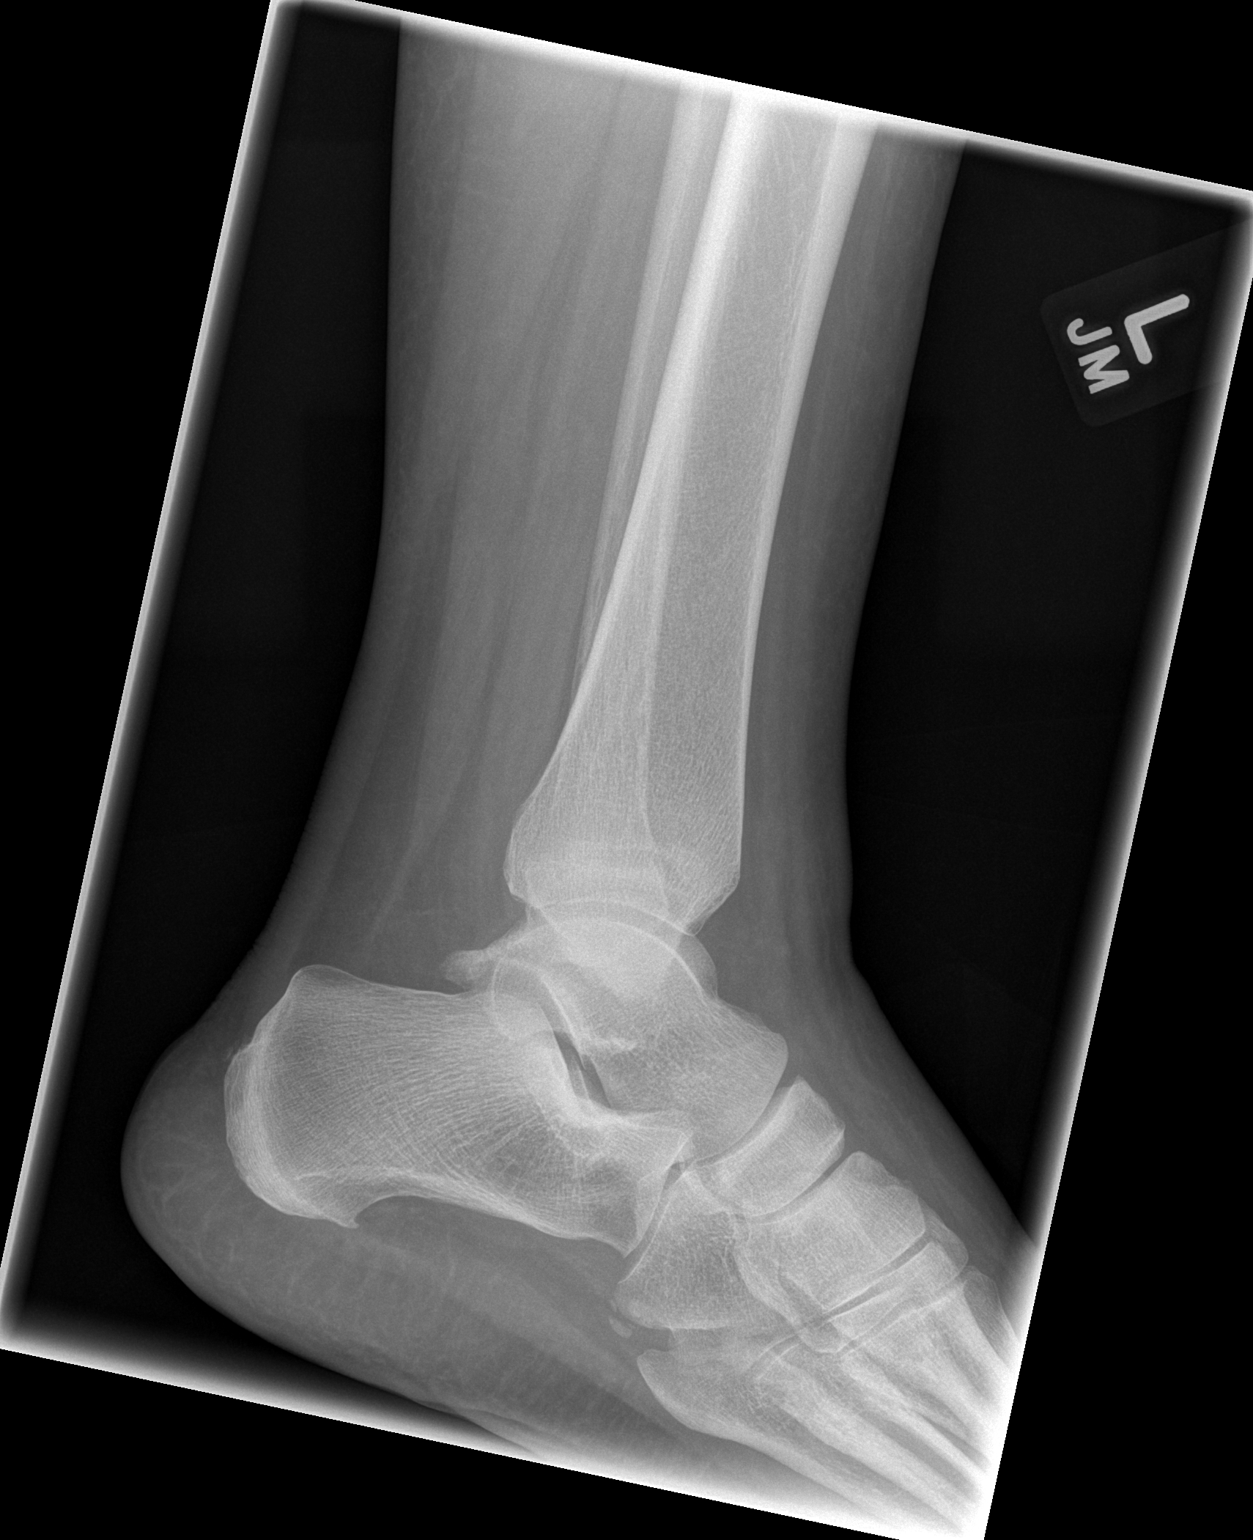

[3 of 3 positions shown; findings below may reference images not displayed]

FINDINGS: There is no evidence of fracture, dislocation, or joint effusion.
There is no evidence of arthropathy or other focal bone abnormality.
Soft tissues are unremarkable.
IMPRESSION: Negative exam.

## 2021-02-15 ENCOUNTER — Emergency Department (HOSPITAL_BASED_OUTPATIENT_CLINIC_OR_DEPARTMENT_OTHER)
Admission: EM | Admit: 2021-02-15 | Discharge: 2021-02-15 | Disposition: A | Discharge status: Home / Self Care | Payer: BC Managed Care – PPO | Attending: Emergency Medicine | Admitting: Emergency Medicine

## 2021-02-15 ENCOUNTER — Other Ambulatory Visit: Payer: Self-pay

## 2021-02-15 ENCOUNTER — Encounter (HOSPITAL_BASED_OUTPATIENT_CLINIC_OR_DEPARTMENT_OTHER): Payer: Self-pay | Admitting: *Deleted

## 2021-02-15 DIAGNOSIS — F17210 Nicotine dependence, cigarettes, uncomplicated: Secondary | ICD-10-CM | POA: Diagnosis not present

## 2021-02-15 DIAGNOSIS — D509 Iron deficiency anemia, unspecified: Secondary | ICD-10-CM | POA: Diagnosis not present

## 2021-02-15 DIAGNOSIS — Z20822 Contact with and (suspected) exposure to covid-19: Secondary | ICD-10-CM | POA: Insufficient documentation

## 2021-02-15 DIAGNOSIS — R197 Diarrhea, unspecified: Secondary | ICD-10-CM

## 2021-02-15 DIAGNOSIS — R5383 Other fatigue: Secondary | ICD-10-CM | POA: Diagnosis present

## 2021-02-15 DIAGNOSIS — E876 Hypokalemia: Secondary | ICD-10-CM | POA: Diagnosis not present

## 2021-02-15 LAB — CBC WITH DIFFERENTIAL/PLATELET
Abs Immature Granulocytes: 0.01 10*3/uL (ref 0.00–0.07)
Basophils Absolute: 0.1 10*3/uL (ref 0.0–0.1)
Basophils Relative: 2 %
Eosinophils Absolute: 0.1 10*3/uL (ref 0.0–0.5)
Eosinophils Relative: 1 %
HCT: 31.6 % — ABNORMAL LOW (ref 36.0–46.0)
Hemoglobin: 9.2 g/dL — ABNORMAL LOW (ref 12.0–15.0)
Immature Granulocytes: 0 %
Lymphocytes Relative: 29 %
Lymphs Abs: 1.8 10*3/uL (ref 0.7–4.0)
MCH: 19.7 pg — ABNORMAL LOW (ref 26.0–34.0)
MCHC: 29.1 g/dL — ABNORMAL LOW (ref 30.0–36.0)
MCV: 67.8 fL — ABNORMAL LOW (ref 80.0–100.0)
Monocytes Absolute: 0.5 10*3/uL (ref 0.1–1.0)
Monocytes Relative: 8 %
Neutro Abs: 3.7 10*3/uL (ref 1.7–7.7)
Neutrophils Relative %: 60 %
Platelets: 225 10*3/uL (ref 150–400)
RBC: 4.66 MIL/uL (ref 3.87–5.11)
RDW: 20.5 % — ABNORMAL HIGH (ref 11.5–15.5)
WBC: 6.2 10*3/uL (ref 4.0–10.5)
nRBC: 0 % (ref 0.0–0.2)

## 2021-02-15 LAB — URINALYSIS, ROUTINE W REFLEX MICROSCOPIC
Bilirubin Urine: NEGATIVE
Glucose, UA: NEGATIVE mg/dL
Hgb urine dipstick: NEGATIVE
Ketones, ur: NEGATIVE mg/dL
Nitrite: NEGATIVE
Protein, ur: NEGATIVE mg/dL
Specific Gravity, Urine: 1.02 (ref 1.005–1.030)
pH: 6.5 (ref 5.0–8.0)

## 2021-02-15 LAB — TSH: TSH: 0.753 u[IU]/mL (ref 0.350–4.500)

## 2021-02-15 LAB — COMPREHENSIVE METABOLIC PANEL
ALT: 11 U/L (ref 0–44)
AST: 17 U/L (ref 15–41)
Albumin: 3.9 g/dL (ref 3.5–5.0)
Alkaline Phosphatase: 63 U/L (ref 38–126)
Anion gap: 7 (ref 5–15)
BUN: 15 mg/dL (ref 6–20)
CO2: 26 mmol/L (ref 22–32)
Calcium: 8.7 mg/dL — ABNORMAL LOW (ref 8.9–10.3)
Chloride: 108 mmol/L (ref 98–111)
Creatinine, Ser: 0.54 mg/dL (ref 0.44–1.00)
GFR, Estimated: >60 mL/min (ref >60)
Glucose, Bld: 97 mg/dL (ref 70–99)
Potassium: 3.3 mmol/L — ABNORMAL LOW (ref 3.5–5.1)
Sodium: 141 mmol/L (ref 135–145)
Total Bilirubin: 0.5 mg/dL (ref 0.3–1.2)
Total Protein: 7.8 g/dL (ref 6.5–8.1)

## 2021-02-15 LAB — URINALYSIS, MICROSCOPIC (REFLEX)

## 2021-02-15 LAB — MAGNESIUM: Magnesium: 1.7 mg/dL (ref 1.7–2.4)

## 2021-02-15 LAB — RESP PANEL BY RT-PCR (FLU A&B, COVID) ARPGX2
Influenza A by PCR: NEGATIVE
Influenza B by PCR: NEGATIVE
SARS Coronavirus 2 by RT PCR: NEGATIVE

## 2021-02-15 MED ORDER — POTASSIUM CHLORIDE CRYS ER 20 MEQ PO TBCR
40.0000 meq | EXTENDED_RELEASE_TABLET | Freq: Once | ORAL | Status: AC
  Administered 2021-02-15: 40 meq via ORAL
  Filled 2021-02-15: qty 2

## 2021-02-15 MED ORDER — FERROUS SULFATE 325 (65 FE) MG PO TABS: 325.0000 mg | ORAL_TABLET | Freq: Two times a day (BID) | ORAL | 0 refills | Status: AC

## 2021-02-15 MED ORDER — SODIUM CHLORIDE 0.9 % IV BOLUS
1000.0000 mL | Freq: Once | INTRAVENOUS | Status: AC
  Administered 2021-02-15: 1000 mL via INTRAVENOUS

## 2021-02-15 NOTE — ED Notes (Signed)
ED Provider at bedside. 

## 2021-02-15 NOTE — ED Provider Notes (Signed)
[facility] EMERGENCY DEPARTMENT Provider Note   CSN: [id number] Arrival date & time: 02/15/21  0813     History Chief Complaint  Patient presents with   Fatigue    Vanessa Gregory is a 50 y.o. female.  Pt presents to the ED today not feeling well.  She has not been feeling well for the past 2 weeks.  She has had diarrhea.  No fever or pain.  She feels very tired and her mouth feels dry.  She felt the worse this past weekend.        Past Medical History:  Diagnosis Date   Obesity     There are no problems to display for this patient.   Past Surgical History:  Procedure Laterality Date   OVARIAN CYST SURGERY       OB History   No obstetric history on file.     History reviewed. No pertinent family history.  Social History   Tobacco Use   Smoking status: Every Day    Packs/day: 0.50    Years: 20.00    Pack years: 10.00    Types: Cigarettes   Smokeless tobacco: Never  Substance Use Topics   Alcohol use: Yes    Alcohol/week: 5.0 standard drinks    Types: 5 Glasses of wine per week    Comment: some days   Drug use: No    Home Medications Prior to Admission medications   Medication Sig Start Date End Date Taking? Authorizing Provider  ferrous sulfate 325 (65 FE) MG tablet Take 1 tablet (325 mg total) by mouth 2 (two) times daily. 02/15/21  Yes Jacalyn Lefevre, MD    Allergies    Patient has no known allergies.  Review of Systems   Review of Systems  Constitutional:  Positive for fatigue.  HENT:         Dry mouth  Gastrointestinal:  Positive for diarrhea.  Neurological:  Positive for weakness.  All other systems reviewed and are negative.  Physical Exam Updated Vital Signs BP 136/73   Pulse 74   Temp 98.7 F (37.1 C) (Oral)   Resp 18   Ht 5\' 9"  (1.753 m)   Wt 108.9 kg   SpO2 98%   BMI 35.44 kg/m   Physical Exam Vitals and nursing note reviewed.  Constitutional:      Appearance: Normal appearance. She is obese.  HENT:      Head: Normocephalic and atraumatic.     Right Ear: External ear normal.     Left Ear: External ear normal.     Nose: Nose normal.     Mouth/Throat:     Mouth: Mucous membranes are dry.  Eyes:     Extraocular Movements: Extraocular movements intact.     Conjunctiva/sclera: Conjunctivae normal.     Pupils: Pupils are equal, round, and reactive to light.  Cardiovascular:     Rate and Rhythm: Normal rate and regular rhythm.     Pulses: Normal pulses.     Heart sounds: Normal heart sounds.  Pulmonary:     Effort: Pulmonary effort is normal.     Breath sounds: Normal breath sounds.  Abdominal:     General: Abdomen is flat. Bowel sounds are normal.     Palpations: Abdomen is soft.  Musculoskeletal:        General: Normal range of motion.     Cervical back: Normal range of motion and neck supple.  Skin:    General: Skin is warm.  Capillary Refill: Capillary refill takes less than 2 seconds.  Neurological:     General: No focal deficit present.     Mental Status: She is alert and oriented to person, place, and time.  Psychiatric:        Mood and Affect: Mood normal.        Behavior: Behavior normal.        Thought Content: Thought content normal.    ED Results / Procedures / Treatments   Labs (all labs ordered are listed, but only abnormal results are displayed) Labs Reviewed  COMPREHENSIVE METABOLIC PANEL - Abnormal; Notable for the following components:      Result Value   Potassium 3.3 (*)    Calcium 8.7 (*)    All other components within normal limits  CBC WITH DIFFERENTIAL/PLATELET - Abnormal; Notable for the following components:   Hemoglobin 9.2 (*)    HCT 31.6 (*)    MCV 67.8 (*)    MCH 19.7 (*)    MCHC 29.1 (*)    RDW 20.5 (*)    All other components within normal limits  URINALYSIS, ROUTINE W REFLEX MICROSCOPIC - Abnormal; Notable for the following components:   APPearance HAZY (*)    Leukocytes,Ua SMALL (*)    All other components within normal limits   URINALYSIS, MICROSCOPIC (REFLEX) - Abnormal; Notable for the following components:   Bacteria, UA MANY (*)    All other components within normal limits  RESP PANEL BY RT-PCR (FLU A&B, COVID) ARPGX2  MAGNESIUM  TSH    EKG None  Radiology No results found.  Procedures Procedures   Medications Ordered in ED Medications  sodium chloride 0.9 % bolus 1,000 mL (0 mLs Intravenous Stopped 02/15/21 1000)  potassium chloride SA (KLOR-CON) CR tablet 40 mEq (40 mEq Oral Given 02/15/21 1117)    ED Course  I have reviewed the triage vital signs and the nursing notes.  Pertinent labs & imaging results that were available during my care of the patient were reviewed by me and considered in my medical decision making (see chart for details).    MDM Rules/Calculators/A&P                           Pt has not had any diarrhea while here.  K is slightly low, so she is given 1 dose of kdur.  Hgb is low (9.2).  The last labs I can find show her that her hgb was 12.5 in 2019.  She has not been taking her iron pills.  I will put her back on them.  She has an appt with her pcp in a few weeks and pcp can set pt up with iron transfusions if needed, but she has responded to pills in the past.  She is to return if worse. Final Clinical Impression(s) / ED Diagnoses Final diagnoses:  Iron deficiency anemia, unspecified iron deficiency anemia type  Diarrhea, unspecified type  Hypokalemia    Rx / DC Orders ED Discharge Orders          Ordered    ferrous sulfate 325 (65 FE) MG tablet  2 times daily        02/15/21 1113             Jacalyn Lefevre, MD 02/15/21 1137

## 2021-02-15 NOTE — ED Triage Notes (Signed)
Presents today with generalized complaints of not feeling well, dry mouth, feels tired all the time. "I just do not feel good", onset over 2weeks ago

## 2022-08-30 ENCOUNTER — Encounter: Payer: Self-pay | Admitting: *Deleted

## 2022-10-23 ENCOUNTER — Emergency Department (HOSPITAL_BASED_OUTPATIENT_CLINIC_OR_DEPARTMENT_OTHER)
Admission: EM | Admit: 2022-10-23 | Discharge: 2022-10-23 | Disposition: A | Discharge status: Home / Self Care | Payer: BC Managed Care – PPO | Attending: Emergency Medicine | Admitting: Emergency Medicine

## 2022-10-23 ENCOUNTER — Other Ambulatory Visit: Payer: Self-pay

## 2022-10-23 ENCOUNTER — Encounter (HOSPITAL_BASED_OUTPATIENT_CLINIC_OR_DEPARTMENT_OTHER): Payer: Self-pay

## 2022-10-23 DIAGNOSIS — Z79899 Other long term (current) drug therapy: Secondary | ICD-10-CM | POA: Insufficient documentation

## 2022-10-23 DIAGNOSIS — R309 Painful micturition, unspecified: Secondary | ICD-10-CM | POA: Diagnosis present

## 2022-10-23 DIAGNOSIS — N3000 Acute cystitis without hematuria: Secondary | ICD-10-CM | POA: Diagnosis not present

## 2022-10-23 DIAGNOSIS — E876 Hypokalemia: Secondary | ICD-10-CM | POA: Diagnosis not present

## 2022-10-23 DIAGNOSIS — I10 Essential (primary) hypertension: Secondary | ICD-10-CM | POA: Diagnosis not present

## 2022-10-23 HISTORY — DX: Essential (primary) hypertension: I10

## 2022-10-23 LAB — CBC WITH DIFFERENTIAL/PLATELET
Abs Immature Granulocytes: 0.06 10*3/uL (ref 0.00–0.07)
Basophils Absolute: 0.1 10*3/uL (ref 0.0–0.1)
Basophils Relative: 0 %
Eosinophils Absolute: 0.4 10*3/uL (ref 0.0–0.5)
Eosinophils Relative: 3 %
HCT: 32.7 % — ABNORMAL LOW (ref 36.0–46.0)
Hemoglobin: 9.8 g/dL — ABNORMAL LOW (ref 12.0–15.0)
Immature Granulocytes: 1 %
Lymphocytes Relative: 7 %
Lymphs Abs: 0.8 10*3/uL (ref 0.7–4.0)
MCH: 21.4 pg — ABNORMAL LOW (ref 26.0–34.0)
MCHC: 30 g/dL (ref 30.0–36.0)
MCV: 71.4 fL — ABNORMAL LOW (ref 80.0–100.0)
Monocytes Absolute: 1.7 10*3/uL — ABNORMAL HIGH (ref 0.1–1.0)
Monocytes Relative: 13 %
Neutro Abs: 9.8 10*3/uL — ABNORMAL HIGH (ref 1.7–7.7)
Neutrophils Relative %: 76 %
Platelets: 216 10*3/uL (ref 150–400)
RBC: 4.58 MIL/uL (ref 3.87–5.11)
RDW: 19.6 % — ABNORMAL HIGH (ref 11.5–15.5)
WBC: 12.8 10*3/uL — ABNORMAL HIGH (ref 4.0–10.5)
nRBC: 0 % (ref 0.0–0.2)

## 2022-10-23 LAB — URINALYSIS, W/ REFLEX TO CULTURE (INFECTION SUSPECTED)
Bilirubin Urine: NEGATIVE
Glucose, UA: NEGATIVE mg/dL
Ketones, ur: NEGATIVE mg/dL
Nitrite: NEGATIVE
Protein, ur: >=300 mg/dL — AB
Specific Gravity, Urine: 1.02 (ref 1.005–1.030)
WBC, UA: >50 WBC/hpf (ref 0–5)
pH: 6.5 (ref 5.0–8.0)

## 2022-10-23 LAB — URINALYSIS, MICROSCOPIC (REFLEX): WBC, UA: >50 WBC/hpf (ref 0–5)

## 2022-10-23 LAB — URINALYSIS, ROUTINE W REFLEX MICROSCOPIC
Bilirubin Urine: NEGATIVE
Glucose, UA: NEGATIVE mg/dL
Ketones, ur: NEGATIVE mg/dL
Nitrite: POSITIVE — AB
Protein, ur: >=300 mg/dL — AB
Specific Gravity, Urine: 1.025 (ref 1.005–1.030)
pH: 6.5 (ref 5.0–8.0)

## 2022-10-23 LAB — BASIC METABOLIC PANEL
Anion gap: 12 (ref 5–15)
BUN: 10 mg/dL (ref 6–20)
CO2: 25 mmol/L (ref 22–32)
Calcium: 8.7 mg/dL — ABNORMAL LOW (ref 8.9–10.3)
Chloride: 101 mmol/L (ref 98–111)
Creatinine, Ser: 0.63 mg/dL (ref 0.44–1.00)
GFR, Estimated: >60 mL/min (ref >60)
Glucose, Bld: 119 mg/dL — ABNORMAL HIGH (ref 70–99)
Potassium: 2.5 mmol/L — CL (ref 3.5–5.1)
Sodium: 138 mmol/L (ref 135–145)

## 2022-10-23 LAB — MAGNESIUM: Magnesium: 1.7 mg/dL (ref 1.7–2.4)

## 2022-10-23 LAB — PREGNANCY, URINE: Preg Test, Ur: NEGATIVE

## 2022-10-23 MED ORDER — CEPHALEXIN 500 MG PO CAPS: 500.0000 mg | ORAL_CAPSULE | Freq: Two times a day (BID) | ORAL | 0 refills | Status: AC

## 2022-10-23 MED ORDER — AMLODIPINE BESYLATE 5 MG PO TABS: 5.0000 mg | ORAL_TABLET | Freq: Every day | ORAL | 0 refills | Status: AC

## 2022-10-23 MED ORDER — CEPHALEXIN 250 MG PO CAPS
500.0000 mg | ORAL_CAPSULE | Freq: Once | ORAL | Status: AC
  Administered 2022-10-23: 500 mg via ORAL
  Filled 2022-10-23: qty 2

## 2022-10-23 MED ORDER — POTASSIUM CHLORIDE CRYS ER 20 MEQ PO TBCR: 20.0000 meq | EXTENDED_RELEASE_TABLET | Freq: Every day | ORAL | 0 refills | Status: AC

## 2022-10-23 MED ORDER — ACETAMINOPHEN 500 MG PO TABS
1000.0000 mg | ORAL_TABLET | Freq: Once | ORAL | Status: AC
  Administered 2022-10-23: 1000 mg via ORAL
  Filled 2022-10-23: qty 2

## 2022-10-23 MED ORDER — POTASSIUM CHLORIDE CRYS ER 20 MEQ PO TBCR
40.0000 meq | EXTENDED_RELEASE_TABLET | Freq: Once | ORAL | Status: AC
  Administered 2022-10-23: 40 meq via ORAL
  Filled 2022-10-23: qty 2

## 2022-10-23 MED ORDER — POTASSIUM CHLORIDE 10 MEQ/100ML IV SOLN
10.0000 meq | INTRAVENOUS | Status: AC
  Administered 2022-10-23 (×2): 10 meq via INTRAVENOUS
  Filled 2022-10-23 (×2): qty 100

## 2022-10-23 NOTE — Discharge Instructions (Addendum)
He needs to make an appointment to follow-up with your primary care doctor within the next week to have your blood pressure rechecked and your potassium level rechecked.  Return to the emergency room with any worsening symptoms.

## 2022-10-23 NOTE — ED Provider Notes (Signed)
[facility] Provider Note   CSN: [id number] Arrival date & time: 10/23/22  4132     History  Chief Complaint  Patient presents with   Headache   Vaginal Discharge    Vanessa Gregory is a 52 y.o. female.  Patient is a 52 year old female who presents with some concerns about her blood pressure.  She says she has been having headache for about the last week.  It is in the frontal area.  She had no associated nausea or vomiting.  No dizziness.  No difficulty with her balance.  No numbness or weakness to her extremities.  No sinus congestion.  No fevers.  She also has been feeling fatigued and has a little bit of burning on urination.  She does say that she was previously on hydrochlorothiazide but has not taken it for the last few months given that she that she is feeling better.  She is concerned that her blood pressure may be contributing to her headaches.  She has had similar headaches before but only when her blood pressure has been elevated.       Home Medications Prior to Admission medications   Medication Sig Start Date End Date Taking? Authorizing Provider  amLODipine (NORVASC) 5 MG tablet Take 1 tablet (5 mg total) by mouth daily. 10/23/22  Yes Rolan Bucco, MD  cephALEXin (KEFLEX) 500 MG capsule Take 1 capsule (500 mg total) by mouth 2 (two) times daily. 10/23/22  Yes Rolan Bucco, MD  potassium chloride SA (KLOR-CON M) 20 MEQ tablet Take 1 tablet (20 mEq total) by mouth daily. 10/23/22  Yes Rolan Bucco, MD  ferrous sulfate 325 (65 FE) MG tablet Take 1 tablet (325 mg total) by mouth 2 (two) times daily. 02/15/21   Jacalyn Lefevre, MD      Allergies    Patient has no known allergies.    Review of Systems   Review of Systems  Constitutional:  Positive for fatigue. Negative for chills, diaphoresis and fever.  HENT:  Negative for congestion, rhinorrhea and sneezing.   Eyes: Negative.   Respiratory:  Negative for cough, chest  tightness and shortness of breath.   Cardiovascular:  Negative for chest pain and leg swelling.  Gastrointestinal:  Negative for abdominal pain, blood in stool, diarrhea, nausea and vomiting.  Genitourinary:  Negative for difficulty urinating, flank pain, frequency and hematuria.  Musculoskeletal:  Negative for arthralgias and back pain.  Skin:  Negative for rash.  Neurological:  Positive for headaches. Negative for dizziness, speech difficulty, weakness and numbness.    Physical Exam Updated Vital Signs BP 132/68   Pulse 92   Temp 98.8 F (37.1 C) (Oral)   Resp 18   Ht 5\' 9"  (1.753 m)   Wt 101.1 kg   SpO2 99%   BMI 32.91 kg/m  Physical Exam Constitutional:      Appearance: She is well-developed.  HENT:     Head: Normocephalic and atraumatic.  Eyes:     Pupils: Pupils are equal, round, and reactive to light.  Cardiovascular:     Rate and Rhythm: Normal rate and regular rhythm.     Heart sounds: Normal heart sounds.  Pulmonary:     Effort: Pulmonary effort is normal. No respiratory distress.     Breath sounds: Normal breath sounds. No wheezing or rales.  Chest:     Chest wall: No tenderness.  Abdominal:     General: Bowel sounds are normal.     Palpations:  Abdomen is soft.     Tenderness: There is no abdominal tenderness. There is no guarding or rebound.  Musculoskeletal:        General: Normal range of motion.     Cervical back: Normal range of motion and neck supple.  Lymphadenopathy:     Cervical: No cervical adenopathy.  Skin:    General: Skin is warm and dry.     Findings: No rash.  Neurological:     Mental Status: She is alert and oriented to person, place, and time.     Comments: Motor 5/5 all extremities Sensation grossly intact to LT all extremities Finger to Nose intact, no pronator drift CN II-XII grossly intact       ED Results / Procedures / Treatments   Labs (all labs ordered are listed, but only abnormal results are displayed) Labs Reviewed   URINALYSIS, ROUTINE W REFLEX MICROSCOPIC - Abnormal; Notable for the following components:      Result Value   APPearance HAZY (*)    Hgb urine dipstick MODERATE (*)    Protein, ur >=300 (*)    Nitrite POSITIVE (*)    Leukocytes,Ua SMALL (*)    All other components within normal limits  URINALYSIS, MICROSCOPIC (REFLEX) - Abnormal; Notable for the following components:   Bacteria, UA MANY (*)    All other components within normal limits  URINALYSIS, W/ REFLEX TO CULTURE (INFECTION SUSPECTED) - Abnormal; Notable for the following components:   APPearance CLOUDY (*)    Hgb urine dipstick MODERATE (*)    Protein, ur >=300 (*)    Leukocytes,Ua SMALL (*)    Bacteria, UA MANY (*)    All other components within normal limits  BASIC METABOLIC PANEL - Abnormal; Notable for the following components:   Potassium 2.5 (*)    Glucose, Bld 119 (*)    Calcium 8.7 (*)    All other components within normal limits  CBC WITH DIFFERENTIAL/PLATELET - Abnormal; Notable for the following components:   WBC 12.8 (*)    Hemoglobin 9.8 (*)    HCT 32.7 (*)    MCV 71.4 (*)    MCH 21.4 (*)    RDW 19.6 (*)    Neutro Abs 9.8 (*)    Monocytes Absolute 1.7 (*)    All other components within normal limits  URINE CULTURE  PREGNANCY, URINE  MAGNESIUM    EKG None  Radiology No results found.  Procedures Procedures    Medications Ordered in ED Medications  acetaminophen (TYLENOL) tablet 1,000 mg (1,000 mg Oral Given 10/23/22 1038)  potassium chloride SA (KLOR-CON M) CR tablet 40 mEq (40 mEq Oral Given 10/23/22 1215)  potassium chloride 10 mEq in 100 mL IVPB (0 mEq Intravenous Stopped 10/23/22 1416)  cephALEXin (KEFLEX) capsule 500 mg (500 mg Oral Given 10/23/22 1415)    ED Course/ Medical Decision Making/ A&P                             Medical Decision Making Amount and/or Complexity of Data Reviewed Labs: ordered.  Risk OTC drugs. Prescription drug management.   Patient is a 52 year old who  presents with concerns about elevated blood pressure.  She stopped taking her hydrochlorothiazide.  She has a bifrontal type headache.  She does not have other concerning findings to suggest subarachnoid hemorrhage or meningitis.  She is neurologically intact.  No concerns for stroke.  No fever.  Her labs reveal significant hypokalemia.  She was given IV fluids and potassium replacement.  Her magnesium level is normal.  She has not had any diarrhea or other symptoms that would likely be leading to the hypokalemia.  Unclear etiology.  She also has evidence of urinary tract infection.  Her hemoglobin is low but similar to prior values based on chart review.  Otherwise she is well-appearing.  Afebrile.  She is currently asymptomatic.  Her headache resolved with a dose of Tylenol.  She was discharged home in good condition.  She was given a prescription for 3 days worth of potassium and I changed her antihypertensive medication to amlodipine.  She was given a 30-day supply of this.  She was also given Keflex to treat her UTI.  She was encouraged to have close follow-up with her PCP and was advised that she needs to have her potassium rechecked within the next week.  Return precautions were given.  Final Clinical Impression(s) / ED Diagnoses Final diagnoses:  Acute cystitis without hematuria  Hypokalemia  Uncontrolled hypertension    Rx / DC Orders ED Discharge Orders          Ordered    amLODipine (NORVASC) 5 MG tablet  Daily        10/23/22 1525    cephALEXin (KEFLEX) 500 MG capsule  2 times daily        10/23/22 1525    potassium chloride SA (KLOR-CON M) 20 MEQ tablet  Daily        10/23/22 1525              Rolan Bucco, MD 10/23/22 1528

## 2022-10-23 NOTE — ED Triage Notes (Signed)
C/o headaches x 1 week. Hx of hypertension, hasn't taken meds in months. Also c/o white vaginal discharge, burning with urination. Not concerned for STD.

## 2022-10-25 LAB — URINE CULTURE: Culture: 80000 — AB

## 2022-10-26 ENCOUNTER — Telehealth (HOSPITAL_BASED_OUTPATIENT_CLINIC_OR_DEPARTMENT_OTHER): Payer: Self-pay

## 2022-10-26 NOTE — Telephone Encounter (Signed)
Post ED Visit - Positive Culture Follow-up  Culture report reviewed by antimicrobial stewardship pharmacist: [facility] [x]  Andreas Ohm, Pharm.D. []  Celedonio Miyamoto, Pharm.D., BCPS AQ-ID []  Garvin Fila, Pharm.D., BCPS []  Georgina Pillion, [address].D., BCPS []  [place], [address].D., BCPS, AAHIVP []  Estella Husk, Pharm.D., BCPS, AAHIVP []  Lysle Pearl, PharmD, BCPS []  Phillips Climes, PharmD, BCPS []  Agapito Games, PharmD, BCPS []  Verlan Friends, PharmD []  Mervyn Gay, PharmD, BCPS []  Vinnie Level, PharmD  [facility] []  Len Childs, PharmD []  Greer Pickerel, PharmD []  Adalberto Cole, PharmD []  Perlie Gold, Rph []  Lonell Face) Jean Rosenthal, PharmD []  Earl Many, PharmD []  Junita Push, PharmD []  Dorna Leitz, PharmD []  Terrilee Files, PharmD []  Lynann Beaver, PharmD []  Keturah Barre, PharmD []  Loralee Pacas, PharmD []  Bernadene Person, PharmD   Positive urine culture Treated with Cephalexin, organism sensitive to the same and no further patient follow-up is required at this time.  Sandria Senter 10/26/2022, 8:46 AM
